# Patient Record
Sex: Male | Born: 1975 | Race: White | Hispanic: No | Marital: Married | Smoking: Former smoker
Health system: Southern US, Community
[De-identification: ages and names within clinical notes are randomized; demographics above are authoritative.]

## PROBLEM LIST (undated history)

## (undated) DIAGNOSIS — E039 Hypothyroidism, unspecified: Secondary | ICD-10-CM

## (undated) DIAGNOSIS — G8929 Other chronic pain: Secondary | ICD-10-CM

## (undated) DIAGNOSIS — G4730 Sleep apnea, unspecified: Secondary | ICD-10-CM

## (undated) DIAGNOSIS — F329 Major depressive disorder, single episode, unspecified: Secondary | ICD-10-CM

## (undated) DIAGNOSIS — I10 Essential (primary) hypertension: Secondary | ICD-10-CM

## (undated) DIAGNOSIS — M519 Unspecified thoracic, thoracolumbar and lumbosacral intervertebral disc disorder: Secondary | ICD-10-CM

## (undated) DIAGNOSIS — M549 Dorsalgia, unspecified: Secondary | ICD-10-CM

## (undated) DIAGNOSIS — E7800 Pure hypercholesterolemia, unspecified: Secondary | ICD-10-CM

## (undated) DIAGNOSIS — M1990 Unspecified osteoarthritis, unspecified site: Secondary | ICD-10-CM

## (undated) DIAGNOSIS — M797 Fibromyalgia: Secondary | ICD-10-CM

## (undated) DIAGNOSIS — F319 Bipolar disorder, unspecified: Secondary | ICD-10-CM

## (undated) DIAGNOSIS — F32A Depression, unspecified: Secondary | ICD-10-CM

## (undated) DIAGNOSIS — E079 Disorder of thyroid, unspecified: Secondary | ICD-10-CM

## (undated) HISTORY — PX: COLONOSCOPY: SHX174

## (undated) HISTORY — PX: BACK SURGERY: SHX140

## (undated) HISTORY — PX: CARPAL TUNNEL RELEASE: SHX101

## (undated) HISTORY — PX: BREAST LUMPECTOMY: SHX2

## (undated) HISTORY — PX: APPENDECTOMY: SHX54

---

## 2007-01-14 ENCOUNTER — Encounter (INDEPENDENT_AMBULATORY_CARE_PROVIDER_SITE_OTHER): Payer: Self-pay | Admitting: General Surgery

## 2007-01-14 ENCOUNTER — Ambulatory Visit (HOSPITAL_COMMUNITY): Admission: RE | Admit: 2007-01-14 | Discharge: 2007-01-14 | Payer: Self-pay | Admitting: General Surgery

## 2008-07-28 ENCOUNTER — Encounter (INDEPENDENT_AMBULATORY_CARE_PROVIDER_SITE_OTHER): Payer: Self-pay | Admitting: General Surgery

## 2008-07-28 ENCOUNTER — Inpatient Hospital Stay (HOSPITAL_COMMUNITY): Admission: EM | Admit: 2008-07-28 | Discharge: 2008-07-29 | Payer: Self-pay | Admitting: Emergency Medicine

## 2010-05-19 IMAGING — CR DG CHEST 1V PORT
1 series · 1 of 1 positions shown · non-contrast
Comparison: None

CLINICAL DATA: Decreased oxygen saturation.  Pancreatitis.

PORTABLE CHEST - 1 VIEW

[view not recorded]
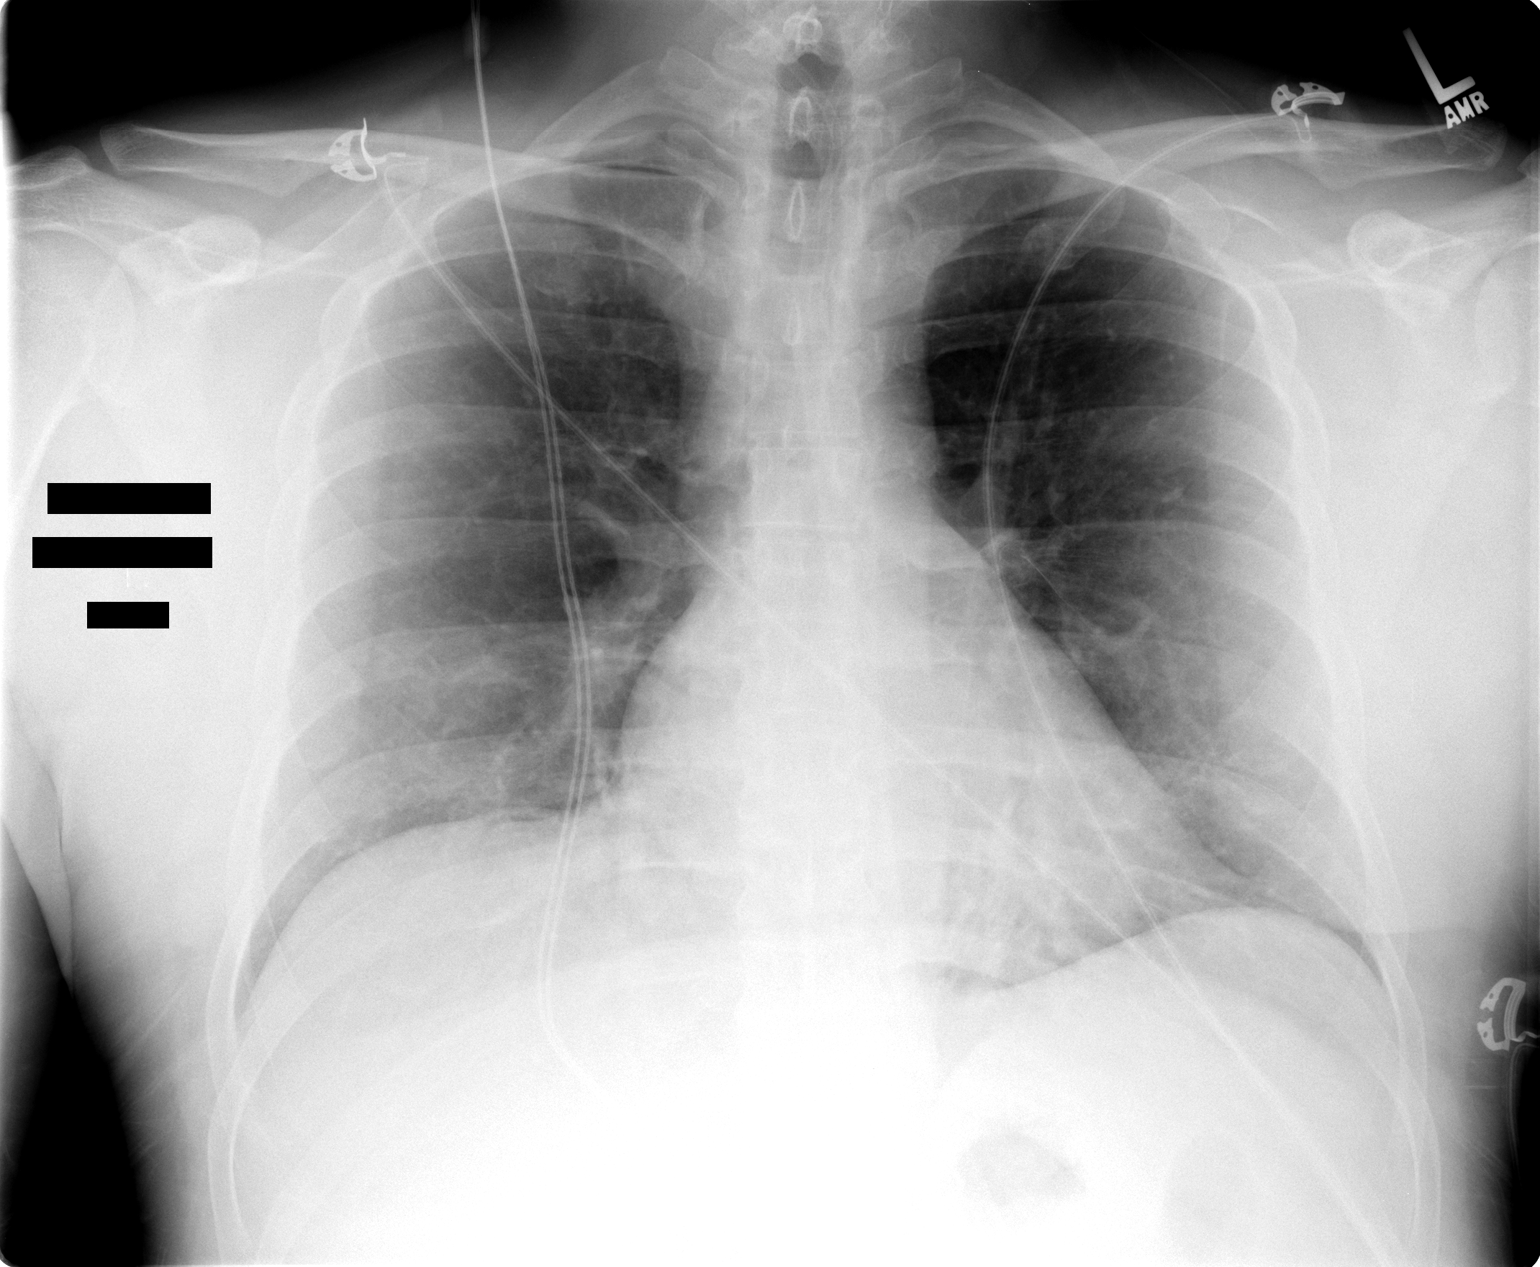

[1 of 1 positions shown; findings below may reference images not displayed]

FINDINGS: The heart size is normal.

There is no pleural effusion or pulmonary edema.

No airspace densities are identified.
IMPRESSION: 1.  No acute cardiopulmonary abnormalities.

## 2010-05-19 IMAGING — CT CT ABDOMEN W/ CM
2 of 4 series · 16 of 46 positions shown, 18 images · IV contrast (Omnipaque 300)
Comparison: None available.

CT ABDOMEN

CLINICAL DATA: Right side abdominal pain and fever.

CT ABDOMEN AND PELVIS WITH CONTRAST
TECHNIQUE: Multidetector CT imaging of the abdomen and pelvis was
performed using the standard protocol following bolus
administration of intravenous contrast.
Contrast: 100 ml Wmnipaque-8LL.

[Series 2: abd_pel_with 5.0 b40f · axial · 0.72mm/px · z∈[-524,-104]mm · 13 of 92 slices shown, 15 images]
[im 4/92  soft-tissue]
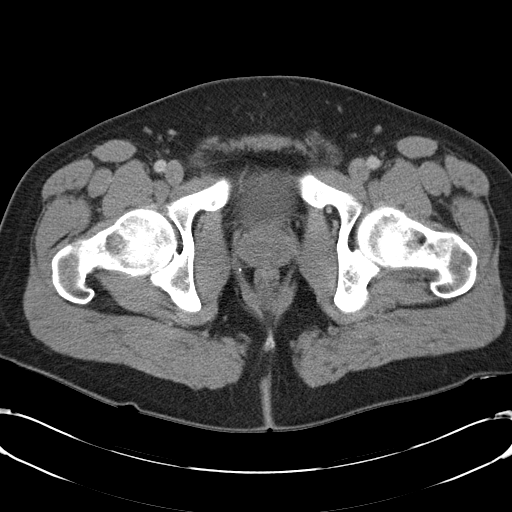
[im 4/92  bone]
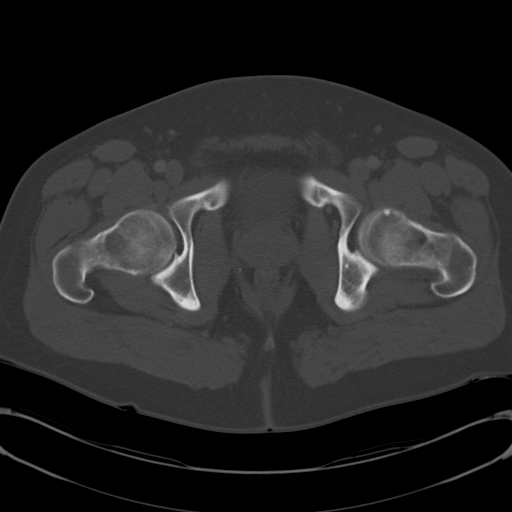
[im 12/92  soft-tissue]
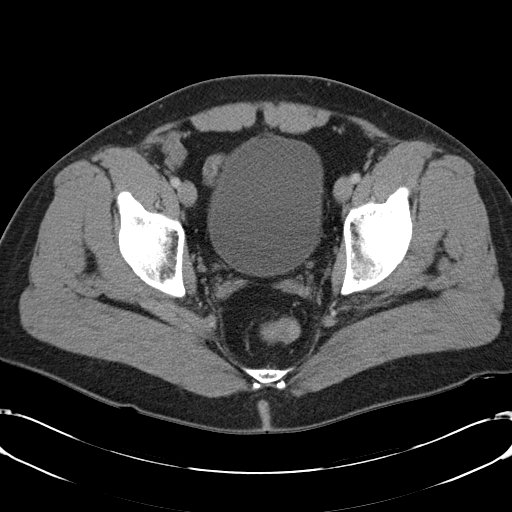
[im 20/92  soft-tissue]
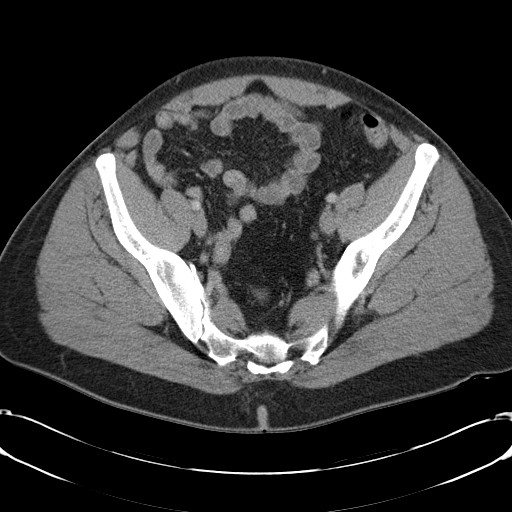
[im 24/92  soft-tissue]
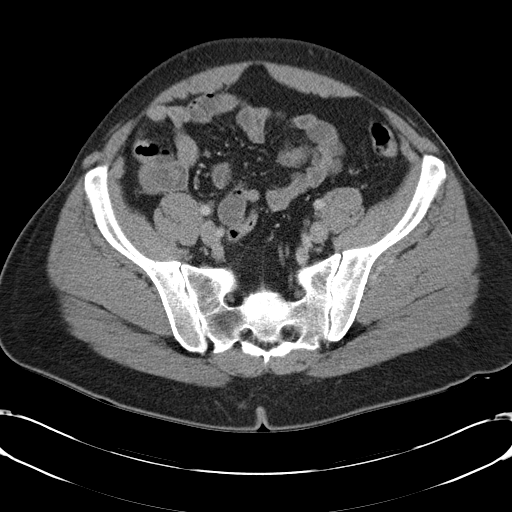
[im 32/92  soft-tissue]
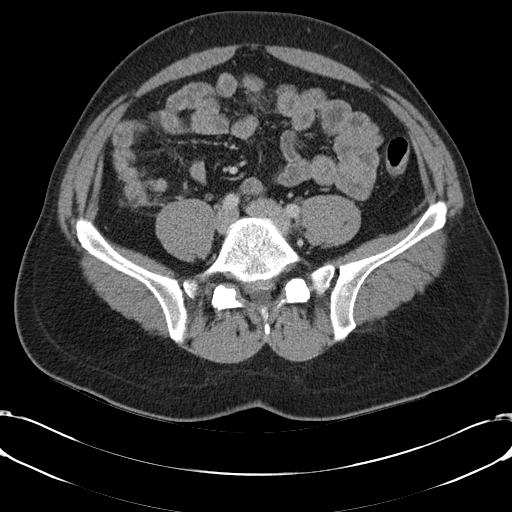
[im 40/92  soft-tissue]
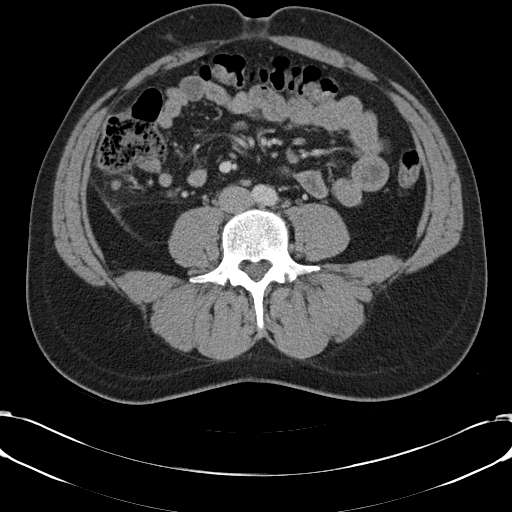
[im 48/92  soft-tissue]
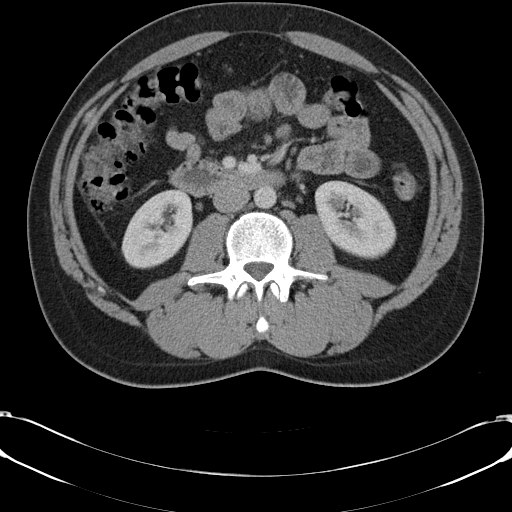
[im 52/92  soft-tissue]
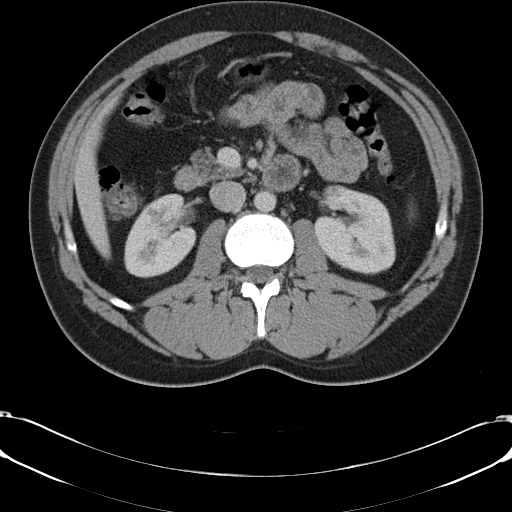
[im 60/92  soft-tissue]
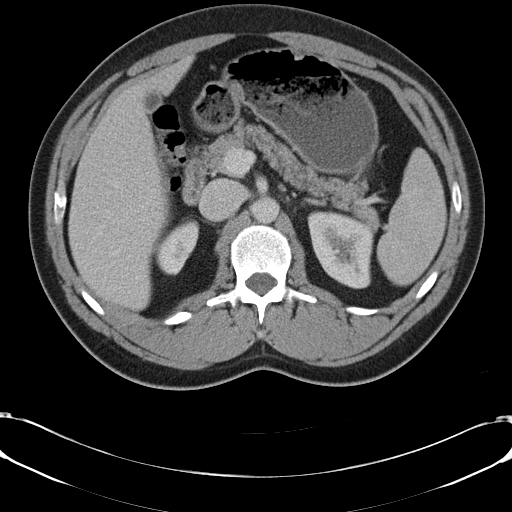
[im 60/92  bone]
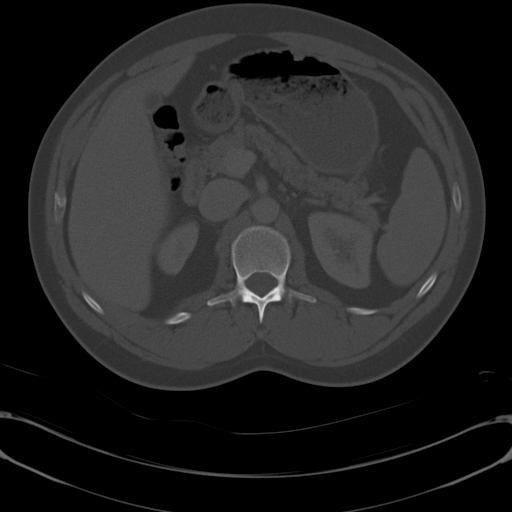
[im 68/92  soft-tissue]
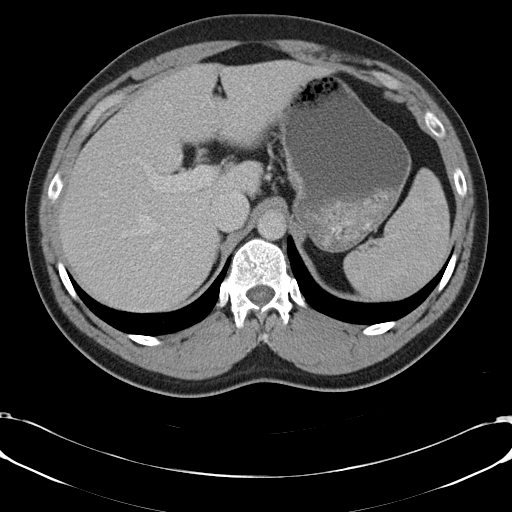
[im 72/92  soft-tissue]
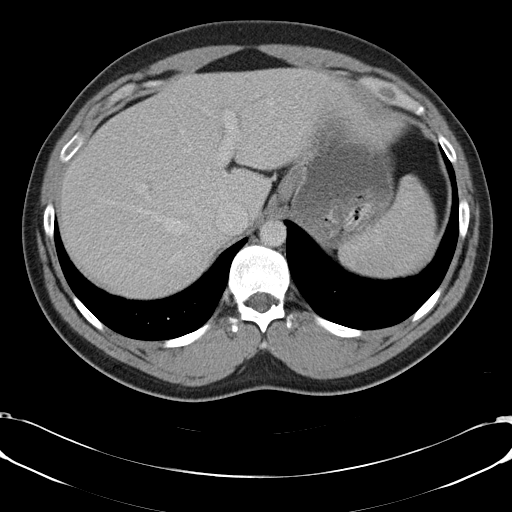
[im 80/92  soft-tissue]
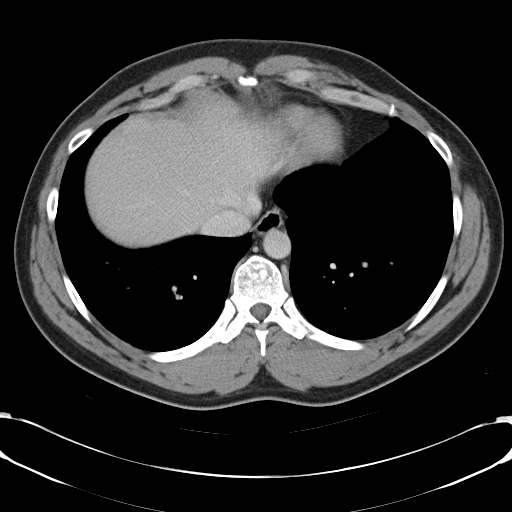
[im 88/92  soft-tissue]
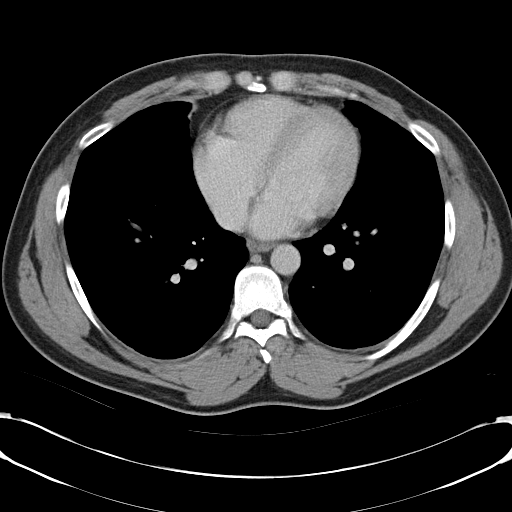

[Series 4: mpr cor post contrast (id) · coronal · 0.69mm/px · 3 of 83 slices shown]
[im 28/83  soft-tissue]
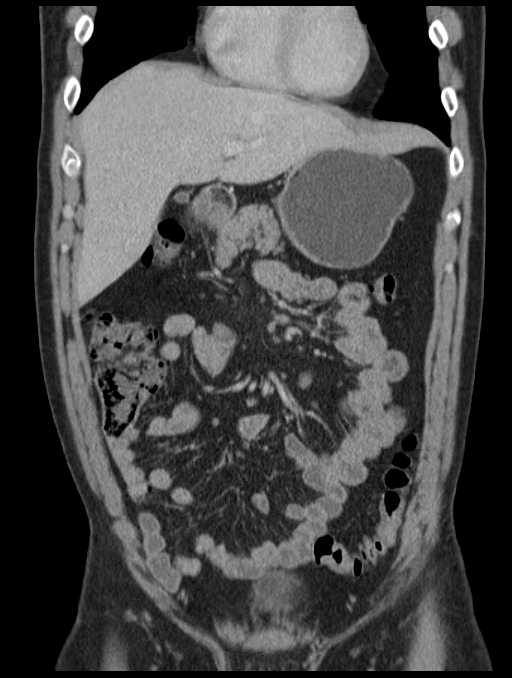
[im 37/83  soft-tissue]
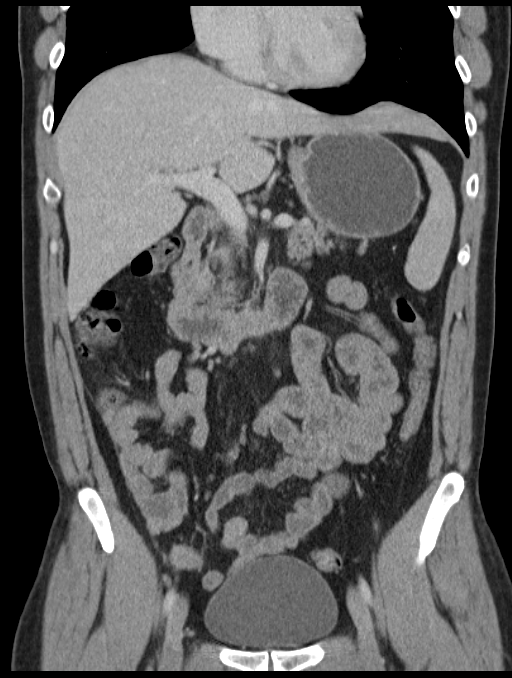
[im 46/83  soft-tissue]
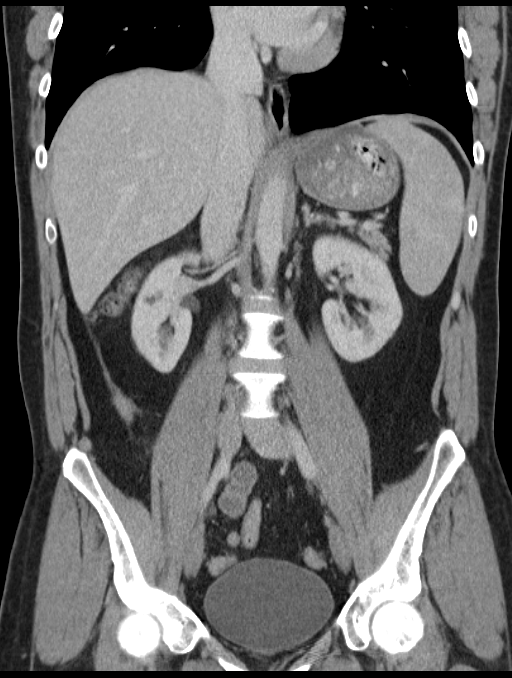

[16 of 46 positions shown; findings below may reference images not displayed]

FINDINGS: The lung bases are clear.  No pleural or pericardial
effusion.

The liver, gallbladder, adrenal glands, spleen and pancreas appear
normal.  Small low attenuating lesions are identified in both
kidneys compatible with cysts.  Kidneys otherwise unremarkable.
Stomach and small bowel appear normal.  No abdominal
lymphadenopathy or fluid.  No focal bony abnormality.
IMPRESSION: No acute finding.

CT PELVIS
FINDINGS: The appendix is dilated with periappendiceal
inflammatory change.  No abscess or perforation.  No pelvic fluid.
Urinary bladder, seminal vesicles and prostate gland are
unremarkable.  The colon appears normal.  No focal bony
abnormality.
IMPRESSION: Study is positive for acute appendicitis without abscess or
perforation.

## 2010-07-13 LAB — COMPREHENSIVE METABOLIC PANEL
AST: 15 U/L (ref 0–37)
Alkaline Phosphatase: 56 U/L (ref 39–117)
CO2: 28 mEq/L (ref 19–32)
Chloride: 105 mEq/L (ref 96–112)
Creatinine, Ser: 1.11 mg/dL (ref 0.4–1.5)
GFR calc Af Amer: >60 mL/min (ref >60)
GFR calc non Af Amer: >60 mL/min (ref >60)
Potassium: 3.7 mEq/L (ref 3.5–5.1)
Total Bilirubin: 0.7 mg/dL (ref 0.3–1.2)

## 2010-07-13 LAB — URINALYSIS, ROUTINE W REFLEX MICROSCOPIC
Glucose, UA: NEGATIVE mg/dL
Hgb urine dipstick: NEGATIVE
Specific Gravity, Urine: >1.03 — ABNORMAL HIGH (ref 1.005–1.030)
Urobilinogen, UA: 0.2 mg/dL (ref 0.0–1.0)

## 2010-07-13 LAB — DIFFERENTIAL
Basophils Absolute: 0 10*3/uL (ref 0.0–0.1)
Basophils Relative: 0 % (ref 0–1)
Eosinophils Absolute: 1.3 10*3/uL — ABNORMAL HIGH (ref 0.0–0.7)
Eosinophils Relative: 16 % — ABNORMAL HIGH (ref 0–5)
Lymphocytes Relative: 20 % (ref 12–46)
Lymphocytes Relative: 22 % (ref 12–46)
Lymphs Abs: 1.7 10*3/uL (ref 0.7–4.0)
Monocytes Relative: 8 % (ref 3–12)
Neutro Abs: 4 10*3/uL (ref 1.7–7.7)
Neutrophils Relative %: 51 % (ref 43–77)

## 2010-07-13 LAB — BASIC METABOLIC PANEL
BUN: 9 mg/dL (ref 6–23)
Calcium: 8.5 mg/dL (ref 8.4–10.5)
Chloride: 105 mEq/L (ref 96–112)
Creatinine, Ser: 1.18 mg/dL (ref 0.4–1.5)
GFR calc Af Amer: >60 mL/min (ref >60)
GFR calc non Af Amer: >60 mL/min (ref >60)

## 2010-07-13 LAB — CBC
HCT: 41.4 % (ref 39.0–52.0)
MCV: 89.9 fL (ref 78.0–100.0)
Platelets: 139 10*3/uL — ABNORMAL LOW (ref 150–400)
RBC: 4.22 MIL/uL (ref 4.22–5.81)
RBC: 4.61 MIL/uL (ref 4.22–5.81)
WBC: 7.7 10*3/uL (ref 4.0–10.5)
WBC: 8.5 10*3/uL (ref 4.0–10.5)

## 2010-08-16 NOTE — Op Note (Signed)
NAMEWYNNE, JURY            ACCOUNT NO.:  [id number]   MEDICAL RECORD NO.:  [id number]          PATIENT TYPE:  AMB   LOCATION:  DAY                           FACILITY:  [facility]   PHYSICIAN:  Tilford Pillar, MD      DATE OF BIRTH:  1975/11/22   DATE OF PROCEDURE:  01/14/2007  DATE OF DISCHARGE:                               OPERATIVE REPORT   PREOPERATIVE DIAGNOSIS:  Left breast mass.   POSTOPERATIVE DIAGNOSIS:  Left breast mass.   PROCEDURE:  Left breast excisional biopsy.   SURGEON:  Tilford Pillar, M.D.   ANESTHESIA:  MAC and local anesthetic.   SPECIMENS:  Left breast mass.   ESTIMATED BLOOD LOSS:  Minimal.   INDICATIONS:  Patient is a 35 year old male who had presented as an  outpatient to my office with noted fullness and palpable nodule under  his left nipple areolar complex.  This had been present for  approximately a month with no decrease in size.  He had noted no  drainage or any changes to the skin overlying the area; however, based  on concern and need for tissue diagnoses, it was recommended that he  undergo an excisional biopsy.  Risks, benefits and alternatives were  discussed at length with the patient, including the potential risks of  infection and bleeding.  The patient consented for the planned procedure  and is now taken to the operating room.   OPERATION:  Patient was placed in a supine position on the operative  table, at which time the sedation was administered and sedation took  effect.  A laryngeal mask airway was placed by anesthesia.  At this  point, his left chest and arm were prepped with the Betadine solution.  Drapes were placed in a standard fashion.  At this point, a local  anesthetic was injected subareolar.  An infra-areolar incision was  created with a 15 blade of the scalpel.  Additional dissection continued  of the subcuticular tissue was carried using electrocautery.  With a  combination of blunt and electrocautery dissection, the  mass under the  left areolar nipple complex was removed.  This was sent as a permanent  specimen to pathology.  Hemostasis was obtained with electrocautery.  The additional local anesthetic was injected.  The wound was irrigated.  Then 3-0 Vicryls were utilized for reapproximating the deep  subcuticular, and a 4-0 Monocryl was utilized in a subcuticular fashion  to reapproximate the skin edges.  The skin was then washed and dried.  Dermabond was then placed over the incision.  The drapes were removed.  The patient was allowed to come out of sedation and was transferred back  to a regular hospital bed.  He was transferred to the post anesthesia  care unit in stable condition.   At the conclusion of the procedure, all of the sponge and needle counts  were correct.  Patient tolerated the procedure well.      Tilford Pillar, MD  Electronically Signed     BZ/MEDQ  D:  01/14/2007  T:  01/15/2007  Job:  [id number]

## 2010-08-16 NOTE — H&P (Signed)
NAME:  Anthony Weaver, Anthony Weaver            ACCOUNT NO.:  [id number]   MEDICAL RECORD NO.:  [id number]         PATIENT TYPE:  AMB   LOCATION:  DAY                           FACILITY:  [facility]   PHYSICIAN:  Tilford Pillar, MD      DATE OF BIRTH:  10-11-1975   DATE OF ADMISSION:  DATE OF DISCHARGE:  [facility]                              HISTORY & PHYSICAL   CHIEF COMPLAINT:  Lump in left chest.   HISTORY OF PRESENT ILLNESS:  The patient is a 35 year old male with  approximately a 39-month history of a movable lump under his left nipple  in his left chest.  He states this had an insidious onset, no evidence  of any discharge or erythema over the area.  Initially it was tender,  but the tenderness has slightly improved.  He has not noticed any other  swelling or any other lumps or nodules on his axilla.  He has not had  anything sore in the past.  He denies remembering any trauma to the  area.  No family history of breast cancer, uterine cancer or ovarian  cancer in the family.  He denies any weight loss.  He did have a course  of antibiotics early on during the period when the knot was first  noticed under his left nipple, but stated this did not change the  tenderness or the size of the mass in the left breast.  Since the onset  he has not noticed any increase or decrease in size.   PAST MEDICAL HISTORY:  1. Positive for hypothyroidism.  2. Bipolar.   PAST SURGICAL HISTORY:  Bilateral carpal tunnel release.   MEDICATIONS:  1. Levothyroxine 75 mg 1 orally daily.  2. Fluoxetine 40 mg 1 orally daily.  3. Zyprexa 10 mg 1 orally daily.  4. Fexofenadine 180 mg 1 orally daily.  5. Oxazepam 10 mg 2 one time a day.   ALLERGIES:  NO KNOWN DRUG ALLERGIES.   SOCIAL HISTORY:  No tobacco use.  Occasional alcohol use.  No  recreational drug use.  Occupation, the patient works at [employer]  in [place], [place].   FAMILY HISTORY:  Pertinent for father diagnosed with colon cancer.  Mother was diagnosed  with thyroid disease.   REVIEW OF SYSTEMS:  CONSTITUTIONAL: Unremarkable.  HEENT:  Eyes,  unremarkable.  Ears, nose and throat, unremarkable.  RESPIRATORY:  Unremarkable.  CARDIOVASCULAR: Unremarkable.  GASTROINTESTINAL:  Occasional heartburn, indigestion; otherwise unremarkable.  GENITOURINARY:  Unremarkable.  MUSCULOSKELETAL:  Unremarkable.  SKIN:  Unremarkable other than HPI.  ENDOCRINOLOGY:  As patient is on treatment  for his thyroid disease he has had not had any symptomatology of thyroid  insufficiency.  NEUROLOGIC:  Unremarkable.   PHYSICAL EXAMINATION:  GENERAL APPEARANCE:  The patient is healthy, he  is a calm-appearing, well-developed.  HEENT:  No scalp or hair deformities were identified.  Eyes, pupils are  equal, round, reactive.  Extra-ocular movements are intact.  No  conjunctival pallor is noted.  No hearing deficits are noted.  Oral  mucosa was pink.  Normal occlusion.  NECK:  The trachea is midline.  No  thyroid nodule or goiters  appreciated.  No cervical lymphadenopathy is appreciated.  PULMONARY:  He has unlabored respirations.  CARDIOVASCULAR:  Regular rate and rhythm.  Pulses are 2+ bilaterally in  upper extremity and lower extremity.  CHEST:  On evaluation of chest wall, no visual deformities are noted.  On palpation of the left chest wall and breast there is some thickened  tissue deep to his nipple areolar complex on the left side.  This is  mildly tender to palpation.  It is mobile and is not attached to the  underlying tissue.  There is no axillary lymphadenopathy apparent and no  nipple discharge was expressed.  SKIN:  Is warm and dry.   PERTINENT LABORATORY AND X-RAY DATA:  The patient did have bilateral  mammograms and ultrasound of his left breast performed in [place].  Films were brought with the patient as well as the radiologist's report.  On the mammogram there is some irregular soft-tissue density in the left  breast deep to the nipple marker  seen on the mammogram.  In the  ultrasound of the left breast there are irregular hyperechoic nodules,  again, deep to the areolar complex.  Based on radiologist's review he  was given a BI-RADS  level 4 for the radiographic findings.   ASSESSMENT:  Left breast mass.  It was discussed with the patient  options for additional work up of the mass including excisional biopsy  of the mass including observation and repeat evaluation and ultrasound  in the next 2 months.  The benefits of tissue biopsy and pathologic  evaluation were discussed with the patient as well as the risks,  benefits and alternatives to performing the biopsy including the  possible risk of infection and bleeding in the operating room.  Based on  the physical findings, the patient's  history and age, and family  history, it is suspected that this is a benign process, likely  gynecomastia.  However, due to the patient's concern, we will plan on  proceed with biopsy of the lesion and excisional biopsy planned for next  week.      Tilford Pillar, MD     BZ/MEDQ  D:  01/08/2007  T:  01/08/2007  Job:  [id number]   cc:   Della Goo, M.D.  Fax: [phone]

## 2010-08-16 NOTE — H&P (Signed)
NAME:  Anthony Weaver, Anthony Weaver            ACCOUNT NO.:  [id number]   MEDICAL RECORD NO.:  [id number]          PATIENT TYPE:  AMB   LOCATION:  DAY                           FACILITY:  [facility]   PHYSICIAN:  Tilford Pillar, MD      DATE OF BIRTH:  04/05/1975   DATE OF ADMISSION:  DATE OF DISCHARGE:  [facility]                              HISTORY & PHYSICAL   CHIEF COMPLAINT:  Lump in left chest.   HISTORY OF PRESENT ILLNESS:  The patient is a 35 year old male with  approximately a 6-month history of a movable lump under his left nipple  in his left chest.  He states this had an insidious onset, no evidence  of any discharge or erythema over the area.  Initially it was tender,  but the tenderness has slightly improved.  He has not noticed any other  swelling or any other lumps or nodules on his axilla.  He has not had  anything sore in the past.  He denies remembering any trauma to the  area.  No family history of breast cancer, uterine cancer or ovarian  cancer in the family.  He denies any weight loss.  He did have a course  of antibiotics early on during the period when the knot was first  noticed under his left nipple, but stated this did not change the  tenderness or the size of the mass in the left breast.  Since the onset  he has not noticed any increase or decrease in size.   PAST MEDICAL HISTORY:  1. Positive for hypothyroidism.  2. Bipolar.   PAST SURGICAL HISTORY:  Bilateral carpal tunnel release.   MEDICATIONS:  1. Levothyroxine 75 mg 1 orally daily.  2. Fluoxetine 40 mg 1 orally daily.  3. Zyprexa 10 mg 1 orally daily.  4. Fexofenadine 180 mg 1 orally daily.  5. Oxazepam 10 mg 2 one time a day.   ALLERGIES:  NO KNOWN DRUG ALLERGIES.   SOCIAL HISTORY:  No tobacco use.  Occasional alcohol use.  No  recreational drug use.  Occupation, the patient works at [employer]  in [place], [place].   FAMILY HISTORY:  Pertinent for father diagnosed with colon cancer.  Mother was diagnosed  with thyroid disease.   REVIEW OF SYSTEMS:  CONSTITUTIONAL: Unremarkable.  HEENT:  Eyes,  unremarkable.  Ears, nose and throat, unremarkable.  RESPIRATORY:  Unremarkable.  CARDIOVASCULAR: Unremarkable.  GASTROINTESTINAL:  Occasional heartburn, indigestion; otherwise unremarkable.  GENITOURINARY:  Unremarkable.  MUSCULOSKELETAL:  Unremarkable.  SKIN:  Unremarkable other than HPI.  ENDOCRINOLOGY:  As patient is on treatment  for his thyroid disease he has had not had any symptomatology of thyroid  insufficiency.  NEUROLOGIC:  Unremarkable.   PHYSICAL EXAMINATION:  GENERAL APPEARANCE:  The patient is healthy, he  is a calm-appearing, well-developed.  HEENT:  No scalp or hair deformities were identified.  Eyes, pupils are  equal, round, reactive.  Extra-ocular movements are intact.  No  conjunctival pallor is noted.  No hearing deficits are noted.  Oral  mucosa was pink.  Normal occlusion.  NECK:  The trachea is midline.  No thyroid nodule or goiters  appreciated.  No cervical lymphadenopathy is appreciated.  PULMONARY:  He has unlabored respirations.  CARDIOVASCULAR:  Regular rate and rhythm.  Pulses are 2+ bilaterally in  upper extremity and lower extremity.  CHEST:  On evaluation of chest wall, no visual deformities are noted.  On palpation of the left chest wall and breast there is some thickened  tissue deep to his nipple areolar complex on the left side.  This is  mildly tender to palpation.  It is mobile and is not attached to the  underlying tissue.  There is no axillary lymphadenopathy apparent and no  nipple discharge was expressed.  SKIN:  Is warm and dry.   PERTINENT LABORATORY AND X-RAY DATA:  The patient did have bilateral  mammograms and ultrasound of his left breast performed in [place].  Films were brought with the patient as well as the radiologist's report.  On the mammogram there is some irregular soft-tissue density in the left  breast deep to the nipple marker  seen on the mammogram.  In the  ultrasound of the left breast there are irregular hyperechoic nodules,  again, deep to the areolar complex.  Based on radiologist's review he  was given a BI-RADS  level 4 for the radiographic findings.   ASSESSMENT:  Left breast mass.  It was discussed with the patient  options for additional work up of the mass including excisional biopsy  of the mass including observation and repeat evaluation and ultrasound  in the next 2 months.  The benefits of tissue biopsy and pathologic  evaluation were discussed with the patient as well as the risks,  benefits and alternatives to performing the biopsy including the  possible risk of infection and bleeding in the operating room.  Based on  the physical findings, the patient's  history and age, and family  history, it is suspected that this is a benign process, likely  gynecomastia.  However, due to the patient's concern, we will plan on  proceed with biopsy of the lesion and excisional biopsy planned for next  week.      Tilford Pillar, MD  Electronically Signed     BZ/MEDQ  D:  01/08/2007  T:  01/08/2007  Job:  [id number]   cc:   Della Goo, M.D.  Fax: [phone]

## 2010-08-16 NOTE — H&P (Signed)
NAME:  Anthony Weaver, Anthony Weaver            ACCOUNT NO.:  [id number]   MEDICAL RECORD NO.:  [id number]          PATIENT TYPE:  INP   LOCATION:  A326                          FACILITY:  [facility]   PHYSICIAN:  Tilford Pillar, MD      DATE OF BIRTH:  September 15, 1975   DATE OF ADMISSION:  07/27/2008  DATE OF DISCHARGE:  04/28/2010[facility]                              HISTORY & PHYSICAL   CHIEF COMPLAINT:  Abdominal pain.   HISTORY OF PRESENT ILLNESS:  The patient is a 35 year old male  relatively healthy male with a history of hypercholesterolemia and  bipolar disorder, approximately 2 days of increasing right lower  __________ has been constant and it is sharp in characteristics.  There  is no radiation of the pain.  Pain is increased with movement and  palpation.  There has been no change with pain associated with diet.  He  has had no change in bowel movements.  No melena.  No hematochezia.  He  has had questionable subjective fevers at home, although he states he  has not recorded the temperature.  His appetite is not significantly  diminished, although the patient does admit to decreased p.o. intake.  He has had no sick contacts.  He has had no similar symptomatology in  the past.   PAST MEDICAL HISTORY:  Hypercholesterolemia, bipolar disorder, and  hypothyroidism.   PAST SURGICAL HISTORY:  He has had carpal tunnel surgery and a  lumpectomy of a chest mass.   MEDICATIONS:  Synthroid, Lexapro, Klonopin, and Zyprexa.   ALLERGIES:  No known drug allergies.   SOCIAL HISTORY:  No tobacco, no alcohol, no recreational drug use.   REVIEW OF SYSTEMS:  Essentially unremarkable except for HPI for all  systems including constitutional, eyes, ears, nose and throat,  respiratory, cardiovascular, genitourinary, musculoskeletal, skin,  neuro, and endocrine.   PHYSICAL EXAMINATION:  VITAL SIGNS:  Temperature 97.7, heart rate 72,  respiratory rate is 20, and blood pressure 134/67.  GENERAL:  The patient is a  supine position in the emergency room.  Currently, he is not in any distress.  He is alert and oriented x3.  He  does appear uncomfortable, but again he is not in any acute distress.  HEENT:  Scalp, no deformities.  Eyes; pupils equal, round, and reactive.  Extraocular movements are intact.  No conjunctival pallor or scleral  icterus is noted.  Oral mucosa is pink.  Normal occlusion.  NECK:  Trachea is midline.  No cervical lymphadenopathy.  No thyroid  nodularity or goiter is noted.  PULMONARY:  Unlabored respiration.  Chest is clear to auscultation  bilaterally.  No wheezes or crackles are apparent.  CARDIOVASCULAR:  Regular rate and rhythm.  No murmurs or gallops.  He  has 2+ radial, femoral and dorsalis pedis pulses bilaterally.  ABDOMEN:  Diminished bowel sounds.  Abdomen is soft, flat.  He does have  pain at McBurney point.  There is no diffuse peritoneal signs.  No  hernias or masses are elicited.  EXTREMITIES:  Warm and dry.   PERTINENT LABORATORY AND RADIOGRAPHIC STUDIES:  CBC; white blood cell  count is  8.5, hemoglobin 14.5, hematocrit 41.4, and platelets 101.  Basic metabolic panel; sodium 139 potassium 3.7, chloride 105, bicarb  28, BUN 12, creatinine 1.12, and blood glucose 114.  CT of the abdomen  and pelvis demonstrates no evidence of any free air or free fluid.  There is significant dilatation of the appendix with wall thickening of  the appendix, pericecal, and periappendiceal fat stranding noted.   ASSESSMENT/PLAN:  Acute appendicitis.  At this time, the patient is  being kept n.p.o.  He is to continue on IV fluid, resuscitation, and  deep vein thrombosis prophylaxis.  He has been started on Floxin for  antibiotic coverage.  The risks, benefits, and alternatives of the  laparoscopic possible open appendectomy were discussed with the patient  including, but not limited to the risk of bleeding, infection, stump  leak, as well as the possibility of intraoperative cardiac  and pulmonary  events.  The patient's questions and concerns were addressed.  The  patient will be admitted and we will take him to the operating room for  emergent appendectomy.      Tilford Pillar, MD  Electronically Signed     BZ/MEDQ  D:  07/29/2008  T:  07/29/2008  Job:  [id number]   cc:   Carlota Raspberry  Fax: [phone]

## 2010-08-16 NOTE — Op Note (Signed)
NAME:  Anthony Weaver, Anthony Weaver            ACCOUNT NO.:  [id number]   MEDICAL RECORD NO.:  [id number]          PATIENT TYPE:  INP   LOCATION:  A326                          FACILITY:  [facility]   PHYSICIAN:  Tilford Pillar, MD      DATE OF BIRTH:  05-11-1975   DATE OF PROCEDURE:  07/28/2008  DATE OF DISCHARGE:  07/29/2008                               OPERATIVE REPORT   PREOPERATIVE DIAGNOSIS:  Acute appendicitis.   POSTOPERATIVE DIAGNOSIS:  Acute appendicitis.   PROCEDURE:  Emergent laparoscopic appendectomy.   SURGEON:  Tilford Pillar, MD   ANESTHESIA:  General endotracheal, local anesthetic 0.5% Sensorcaine  plain.   SPECIMEN:  Appendix.   ESTIMATED BLOOD LOSS:  Minimal.   DISPOSITION:  Postanesthetic area in stable condition.   INDICATIONS:  The patient is a 35 year old male who presented to [facility] Emergency Department with approximately 2 days of increasing right  lower quadrant abdominal pain.  In the emergency department he was  evaluated and was diagnosed with acute appendicitis, CT confirmed.  Risks, benefits, and alternatives of a laparoscopic possible open  appendectomy have been discussed with the patient, including but not  limited to risk of bleeding, infection, staple line for appendiceal  stump leak, bowel injury as well as possibility intraoperative cardiac  and pulmonary events.  The patient's questions and concerns had been  addressed and the patient consented for the planned procedure.   DESCRIPTION OF PROCEDURE:  The patient was taken to the operating room,  placed in the supine position on the operating table, at which time the  general anesthetic was administered.  Once the patient was asleep, he  was endotracheally intubated by Anesthesia.  At this point a Foley  catheter was placed in standard sterile fashion by myself and his  abdomen was prepped with DuraPrep solution and draped in standard  fashion. A stab incision was created supraumbilically with 11  blade  scalpel.  Additional dissection down through the subcutaneous tissue was  carried out using carried out using a Kocher clamp, which was utilized  to grasp the anterior abdominal fascia anteriorly.  A Veress needle was  inserted.  Saline drop test was utilized to confirm intraperitoneal  placement and then a pneumoperitoneum was initiated.  Once sufficient  pneumoperitoneum was obtained, a 11-mm trocar was inserted over  laparoscope allowing visualization of trocar entering into the  peritoneum.  At this point, the inner cannula was removed.  The  laparoscope was reinserted.  There is no evidence of any trocar or  Veress needle placement injury.  The remaining trocars were placed with  the 5-mm trocar in the suprapubic region and an 11-mm trocar in the left  lateral abdominal wall.  The patient was placed into a Trendelenburg  left lateral decubitus position.  The cecum was identified.  Following  the taenia proximally, the appendix was identified.  The appendix was  noted to be indurated and hyperemic in appearance.  It was grasped and a  window was created between the appendix and the mesoappendix at the base  of the appendix where they join the cecum.  Once  the window was created  a 45 load of an Endo-GIA regular stapler was utilized to divide the base  of the appendix at the cecum and 2 subsequent vascular reloads of the  Endo-GIA 45 stapler were utilized to divide the mesoappendix.  At this  point, the appendix was free, was placed to a EndoCatch bag, which was  placed up to the right upper quadrant of the abdomen.  At this point, I  did inspect staple line.  I examined base of the appendiceal staple line  and there was no evidence of any bleeding from the mesoappendix staple  line.  At this time I did turn my attention to closure.   Using an Endoclose suture passing device, I passed a 2-0 Vicryl suture  to both the 11-mm trocar sites.  These sutures were in place.  The   appendix was grasped in the EndoCatch bag and was removed through the  umbilical trocar site in an intact  EndoCatch bag.  The appendix was  placed in the back table and was sent as a permanent specimen to  Pathology.  A 4-0 Monocryl was utilized to reapproximate the skin edges  at all 4 trocar site.  The skin was washed and dried with a moist and  dry towel.  Benzoin was applied around the incision.  Half inch Steri-  Strips were placed.  The patient was allowed to come out of general  anesthetic.      Tilford Pillar, MD  Electronically Signed     BZ/MEDQ  D:  07/29/2008  T:  07/29/2008  Job:  [id number]

## 2010-08-19 NOTE — Discharge Summary (Signed)
NAME:  DEONTEZ, KLINKE            ACCOUNT NO.:  [id number]   MEDICAL RECORD NO.:  [id number]          PATIENT TYPE:  INP   LOCATION:  A326                          FACILITY:  [facility]   PHYSICIAN:  Tilford Pillar, MD      DATE OF BIRTH:  1975-08-31   DATE OF ADMISSION:  07/27/2008  DATE OF DISCHARGE:  04/28/2010LH                               DISCHARGE SUMMARY   ADMISSION DIAGNOSIS:  Acute appendicitis.   DISCHARGE DIAGNOSES:  1. Acute appendicitis.  2. Bipolar disorder.  3. Hypercholesterolemia.  4. Hypothyroidism.   PROCEDURES:  Laparoscopic appendectomy.   DISPOSITION:  Home.   BRIEF HISTORY AND PHYSICAL:  Please see admission history and physical.   The patient is a 35 year old male, who presents to [facility]  with right lower quadrant abdominal pain on evaluation.  This was  consistent with acute appendicitis.  It was confirmed with CT  evaluation.  Risks, benefits, and alternatives of laparoscopic possible  open appendectomy were discussed and the patient was admitted for  planned procedure and continued management and intervention.   HOSPITAL COURSE:  The patient was admitted on July 28, 2008.  He is a  23-hour observation patient.  He did undergo a laparoscopic  appendectomy.  Tolerate this extremely well as this is an evening  procedure.  He did spend the night, but was discharged within 23 hours  of his admission at the following morning as he was tolerating regular  diet.  Pain was controlled on oral pain medication and ambulating.   DISCHARGE INSTRUCTIONS:  The patient increased his activity as  tolerated.  He should not lift anything greater than 20 pounds for the  next 3-4 weeks.  He may shower, but he is not to soak the incision for 3  weeks.  He may resume a normal diet.   DISCHARGE MEDICATIONS:  The patient is to resume all previously  prescribed home medications, Lortab 5/500 one to two p.o. q.4 h p.r.n.  pain.  The patient is to followup in my  office in 3 weeks unless he has  had any questions, concerns, or problems.      Tilford Pillar, MD  Electronically Signed     BZ/MEDQ  D:  09/14/2008  T:  09/15/2008  Job:  [id number]

## 2011-01-12 LAB — BASIC METABOLIC PANEL
CO2: 29
GFR calc Af Amer: >60
Glucose, Bld: 97
Potassium: 4.1
Sodium: 140

## 2011-01-12 LAB — CBC
HCT: 43.6
Hemoglobin: 14.9
MCHC: 34.2
RBC: 4.92
RDW: 12.6

## 2012-05-10 ENCOUNTER — Other Ambulatory Visit: Payer: Self-pay | Admitting: Physician Assistant

## 2012-05-10 DIAGNOSIS — J329 Chronic sinusitis, unspecified: Secondary | ICD-10-CM

## 2012-07-07 ENCOUNTER — Emergency Department (HOSPITAL_COMMUNITY)
Admission: EM | Admit: 2012-07-07 | Discharge: 2012-07-08 | Disposition: A | Discharge status: Home / Self Care | Payer: BC Managed Care – PPO | Attending: Emergency Medicine | Admitting: Emergency Medicine

## 2012-07-07 ENCOUNTER — Encounter (HOSPITAL_COMMUNITY): Payer: Self-pay

## 2012-07-07 DIAGNOSIS — Z79899 Other long term (current) drug therapy: Secondary | ICD-10-CM | POA: Insufficient documentation

## 2012-07-07 DIAGNOSIS — E7800 Pure hypercholesterolemia, unspecified: Secondary | ICD-10-CM | POA: Insufficient documentation

## 2012-07-07 DIAGNOSIS — Z8739 Personal history of other diseases of the musculoskeletal system and connective tissue: Secondary | ICD-10-CM | POA: Insufficient documentation

## 2012-07-07 DIAGNOSIS — E079 Disorder of thyroid, unspecified: Secondary | ICD-10-CM | POA: Insufficient documentation

## 2012-07-07 DIAGNOSIS — M545 Low back pain, unspecified: Secondary | ICD-10-CM | POA: Insufficient documentation

## 2012-07-07 DIAGNOSIS — M549 Dorsalgia, unspecified: Secondary | ICD-10-CM

## 2012-07-07 DIAGNOSIS — F319 Bipolar disorder, unspecified: Secondary | ICD-10-CM | POA: Insufficient documentation

## 2012-07-07 HISTORY — DX: Bipolar disorder, unspecified: F31.9

## 2012-07-07 HISTORY — DX: Unspecified thoracic, thoracolumbar and lumbosacral intervertebral disc disorder: M51.9

## 2012-07-07 HISTORY — DX: Major depressive disorder, single episode, unspecified: F32.9

## 2012-07-07 HISTORY — DX: Pure hypercholesterolemia, unspecified: E78.00

## 2012-07-07 HISTORY — DX: Disorder of thyroid, unspecified: E07.9

## 2012-07-07 HISTORY — DX: Depression, unspecified: F32.A

## 2012-07-07 MED ORDER — DIAZEPAM 5 MG PO TABS
5.0000 mg | ORAL_TABLET | Freq: Once | ORAL | Status: AC
  Administered 2012-07-08: 5 mg via ORAL
  Filled 2012-07-07: qty 1

## 2012-07-07 MED ORDER — ONDANSETRON HCL 4 MG PO TABS
4.0000 mg | ORAL_TABLET | Freq: Once | ORAL | Status: AC
  Administered 2012-07-08: 4 mg via ORAL
  Filled 2012-07-07: qty 1

## 2012-07-07 MED ORDER — KETOROLAC TROMETHAMINE 10 MG PO TABS
10.0000 mg | ORAL_TABLET | Freq: Once | ORAL | Status: AC
  Administered 2012-07-08: 10 mg via ORAL
  Filled 2012-07-07: qty 1

## 2012-07-07 MED ORDER — HYDROCODONE-ACETAMINOPHEN 5-325 MG PO TABS
2.0000 | ORAL_TABLET | Freq: Once | ORAL | Status: AC
  Administered 2012-07-08: 2 via ORAL
  Filled 2012-07-07: qty 2

## 2012-07-07 NOTE — ED Provider Notes (Signed)
History     CSN: [id number]  Arrival date & time 07/07/12  2310   First MD Initiated Contact with Patient 07/07/12 2315      Chief Complaint  Patient presents with  . Back Pain    (Consider location/radiation/quality/duration/timing/severity/associated sxs/prior treatment) Patient is a 37 y.o. male presenting with back pain. The history is provided by the patient.  Back Pain Location:  Lumbar spine Quality:  Aching Radiates to:  R foot and R posterior upper leg Pain severity:  Severe Pain is:  Same all the time Duration:  2 days Timing:  Constant Progression:  Worsening Chronicity: chronic back pain worsening. Context comment:  Hx of L5S1 disc disease. Relieved by:  Nothing Worsened by:  Movement and standing Ineffective treatments:  Ibuprofen and heating pad Associated symptoms: no abdominal pain, no bladder incontinence, no bowel incontinence, no chest pain, no dysuria and no perianal numbness     Past Medical History  Diagnosis Date  . Disc disorder   . Thyroid disease   . High cholesterol   . Bipolar disorder   . Depression     Past Surgical History  Procedure Laterality Date  . Appendectomy    . Carpal tunnel release      No family history on file.  History  Substance Use Topics  . Smoking status: Not on file  . Smokeless tobacco: Not on file  . Alcohol Use: No      Review of Systems  Constitutional: Negative for activity change.       All ROS Neg except as noted in HPI  HENT: Negative for nosebleeds and neck pain.   Eyes: Negative for photophobia and discharge.  Respiratory: Negative for cough, shortness of breath and wheezing.   Cardiovascular: Negative for chest pain and palpitations.  Gastrointestinal: Negative for abdominal pain, blood in stool and bowel incontinence.  Genitourinary: Negative for bladder incontinence, dysuria, frequency and hematuria.  Musculoskeletal: Positive for back pain. Negative for arthralgias.  Skin: Negative.    Neurological: Negative for dizziness, seizures and speech difficulty.  Psychiatric/Behavioral: Negative for hallucinations and confusion.       Depression    Allergies  Review of patient's allergies indicates no known allergies.  Home Medications   Current Outpatient Rx  Name  Route  Sig  Dispense  Refill  . FLUoxetine (PROZAC) 40 MG capsule   Oral   Take 40 mg by mouth daily.         Marland Kitchen levothyroxine (SYNTHROID, LEVOTHROID) 100 MCG tablet   Oral   Take 100 mcg by mouth daily before breakfast.         . OLANZapine (ZYPREXA) 15 MG tablet   Oral   Take 15 mg by mouth at bedtime.         . pravastatin (PRAVACHOL) 40 MG tablet   Oral   Take 80 mg by mouth at bedtime.           BP 142/91  Pulse 98  Temp(Src) 97.8 F (36.6 C) (Oral)  Resp 20  Ht 6\' 1"  (1.854 m)  Wt 240 lb (108.863 kg)  BMI 31.67 kg/m2  SpO2 99%  Physical Exam  Nursing note and vitals reviewed. Constitutional: He is oriented to person, place, and time. He appears well-developed and well-nourished.  Non-toxic appearance.  HENT:  Head: Normocephalic.  Right Ear: Tympanic membrane and external ear normal.  Left Ear: Tympanic membrane and external ear normal.  Eyes: EOM and lids are normal. Pupils are equal, round, and  reactive to light.  Neck: Normal range of motion. Neck supple. Carotid bruit is not present.  Cardiovascular: Normal rate, regular rhythm, normal heart sounds, intact distal pulses and normal pulses.   Pulmonary/Chest: Breath sounds normal. No respiratory distress.  Abdominal: Soft. Bowel sounds are normal. There is no tenderness. There is no guarding.  Musculoskeletal: Normal range of motion.  Lumbar area tenderness. Paraspinal spasm right more than left. No hot areas.  Lymphadenopathy:       Head (right side): No submandibular adenopathy present.       Head (left side): No submandibular adenopathy present.    He has no cervical adenopathy.  Neurological: He is alert and  oriented to person, place, and time. He has normal strength. No cranial nerve deficit or sensory deficit.  Skin: Skin is warm and dry.  Psychiatric: He has a normal mood and affect. His speech is normal.    ED Course  Procedures (including critical care time)  Labs Reviewed - No data to display No results found.   No diagnosis found.    MDM  I have reviewed nursing notes, vital signs, and all appropriate lab and imaging results for this patient.  Patient has history of degenerative disc disease. He has documented L5 and S1 bulging disc. He is scheduled for MRI on tomorrow. Having increased pain tonight and request assistance.  No gross neurologic deficit appreciated on examination.  Patient treated with Mobic 7.5, Robaxin 500, and Norco one or 2 tablets every 4 hours as needed for pain.      Kathie Dike, PA-C 07/08/12 [phone]

## 2012-07-07 NOTE — ED Notes (Signed)
Low back pain, hurting really bad per pt.  Have an MRI scheduled for tomorrow per pt. L5 and S1 have bulging discs per pt. I already have a surgeon per pt. Just need some relief per pt.

## 2012-07-08 MED ORDER — MELOXICAM 7.5 MG PO TABS: 7.5000 mg | ORAL_TABLET | Freq: Every day | ORAL | Status: DC

## 2012-07-08 MED ORDER — HYDROCODONE-ACETAMINOPHEN 5-325 MG PO TABS: ORAL_TABLET | ORAL | Status: DC

## 2012-07-08 MED ORDER — METHOCARBAMOL 500 MG PO TABS: 500.0000 mg | ORAL_TABLET | Freq: Three times a day (TID) | ORAL | Status: DC

## 2012-07-08 NOTE — ED Provider Notes (Signed)
Medical screening examination/treatment/procedure(s) were performed by non-physician practitioner and as supervising physician I was immediately available for consultation/collaboration.  Nicoletta Dress. Colon Branch, MD 07/08/12 [phone]

## 2013-03-14 ENCOUNTER — Emergency Department (HOSPITAL_COMMUNITY)
Admission: EM | Admit: 2013-03-14 | Discharge: 2013-03-14 | Disposition: A | Discharge status: Home / Self Care | Payer: BC Managed Care – PPO | Attending: Emergency Medicine | Admitting: Emergency Medicine

## 2013-03-14 ENCOUNTER — Emergency Department (HOSPITAL_COMMUNITY): Payer: BC Managed Care – PPO

## 2013-03-14 ENCOUNTER — Encounter (HOSPITAL_COMMUNITY): Payer: Self-pay | Admitting: Emergency Medicine

## 2013-03-14 DIAGNOSIS — Z791 Long term (current) use of non-steroidal anti-inflammatories (NSAID): Secondary | ICD-10-CM | POA: Insufficient documentation

## 2013-03-14 DIAGNOSIS — Z79899 Other long term (current) drug therapy: Secondary | ICD-10-CM | POA: Insufficient documentation

## 2013-03-14 DIAGNOSIS — F319 Bipolar disorder, unspecified: Secondary | ICD-10-CM | POA: Insufficient documentation

## 2013-03-14 DIAGNOSIS — Z8739 Personal history of other diseases of the musculoskeletal system and connective tissue: Secondary | ICD-10-CM | POA: Insufficient documentation

## 2013-03-14 DIAGNOSIS — E079 Disorder of thyroid, unspecified: Secondary | ICD-10-CM | POA: Insufficient documentation

## 2013-03-14 DIAGNOSIS — E7800 Pure hypercholesterolemia, unspecified: Secondary | ICD-10-CM | POA: Insufficient documentation

## 2013-03-14 DIAGNOSIS — R630 Anorexia: Secondary | ICD-10-CM | POA: Insufficient documentation

## 2013-03-14 DIAGNOSIS — K2970 Gastritis, unspecified, without bleeding: Secondary | ICD-10-CM

## 2013-03-14 DIAGNOSIS — R5381 Other malaise: Secondary | ICD-10-CM | POA: Insufficient documentation

## 2013-03-14 LAB — CBC WITH DIFFERENTIAL/PLATELET
Basophils Absolute: 0.1 10*3/uL (ref 0.0–0.1)
Basophils Relative: 1 % (ref 0–1)
Eosinophils Relative: 1 % (ref 0–5)
HCT: 44.7 % (ref 39.0–52.0)
Hemoglobin: 15 g/dL (ref 13.0–17.0)
Lymphocytes Relative: 14 % (ref 12–46)
Lymphs Abs: 1.2 10*3/uL (ref 0.7–4.0)
MCH: 30.5 pg (ref 26.0–34.0)
MCV: 90.9 fL (ref 78.0–100.0)
Monocytes Absolute: 0.7 10*3/uL (ref 0.1–1.0)
Monocytes Relative: 8 % (ref 3–12)
Neutro Abs: 6.7 10*3/uL (ref 1.7–7.7)
RDW: 12.1 % (ref 11.5–15.5)
WBC: 8.7 10*3/uL (ref 4.0–10.5)

## 2013-03-14 LAB — COMPREHENSIVE METABOLIC PANEL
Albumin: 4.3 g/dL (ref 3.5–5.2)
Alkaline Phosphatase: 62 U/L (ref 39–117)
BUN: 18 mg/dL (ref 6–23)
CO2: 30 mEq/L (ref 19–32)
Calcium: 9.7 mg/dL (ref 8.4–10.5)
Chloride: 97 mEq/L (ref 96–112)
Creatinine, Ser: 1.22 mg/dL (ref 0.50–1.35)
GFR calc Af Amer: 85 mL/min — ABNORMAL LOW (ref >90)
GFR calc non Af Amer: 73 mL/min — ABNORMAL LOW (ref >90)
Glucose, Bld: 104 mg/dL — ABNORMAL HIGH (ref 70–99)
Total Bilirubin: 0.5 mg/dL (ref 0.3–1.2)

## 2013-03-14 LAB — OCCULT BLOOD GASTRIC / DUODENUM (SPECIMEN CUP): Occult Blood, Gastric: NEGATIVE — AB

## 2013-03-14 LAB — URINALYSIS, ROUTINE W REFLEX MICROSCOPIC
Ketones, ur: >80 mg/dL — AB
Leukocytes, UA: NEGATIVE
Nitrite: NEGATIVE
Protein, ur: NEGATIVE mg/dL
pH: >9 — ABNORMAL HIGH (ref 5.0–8.0)

## 2013-03-14 LAB — LACTIC ACID, PLASMA: Lactic Acid, Venous: 0.9 mmol/L (ref 0.5–2.2)

## 2013-03-14 LAB — LIPASE, BLOOD: Lipase: 13 U/L (ref 11–59)

## 2013-03-14 MED ORDER — HYDROCODONE-ACETAMINOPHEN 5-325 MG PO TABS: 2.0000 | ORAL_TABLET | ORAL | Status: DC | PRN

## 2013-03-14 MED ORDER — ONDANSETRON HCL 4 MG/2ML IJ SOLN
4.0000 mg | Freq: Once | INTRAMUSCULAR | Status: AC
  Administered 2013-03-14: 4 mg via INTRAVENOUS
  Filled 2013-03-14: qty 2

## 2013-03-14 MED ORDER — IOHEXOL 300 MG/ML  SOLN
100.0000 mL | Freq: Once | INTRAMUSCULAR | Status: AC | PRN
  Administered 2013-03-14: 100 mL via INTRAVENOUS

## 2013-03-14 MED ORDER — IOHEXOL 300 MG/ML  SOLN
50.0000 mL | Freq: Once | INTRAMUSCULAR | Status: AC | PRN
  Administered 2013-03-14: 50 mL via ORAL

## 2013-03-14 MED ORDER — MORPHINE SULFATE 4 MG/ML IJ SOLN
4.0000 mg | Freq: Once | INTRAMUSCULAR | Status: AC
  Administered 2013-03-14: 4 mg via INTRAVENOUS
  Filled 2013-03-14: qty 1

## 2013-03-14 MED ORDER — OMEPRAZOLE 20 MG PO CPDR: 20.0000 mg | DELAYED_RELEASE_CAPSULE | Freq: Every day | ORAL | Status: DC

## 2013-03-14 MED ORDER — ONDANSETRON HCL 4 MG PO TABS: 4.0000 mg | ORAL_TABLET | Freq: Four times a day (QID) | ORAL | Status: DC

## 2013-03-14 MED ORDER — SODIUM CHLORIDE 0.9 % IV BOLUS (SEPSIS)
1000.0000 mL | Freq: Once | INTRAVENOUS | Status: AC
  Administered 2013-03-14: 1000 mL via INTRAVENOUS

## 2013-03-14 NOTE — ED Notes (Signed)
Pt tolerated a cup of ginger ale said he felt like its going to stay down.

## 2013-03-14 NOTE — ED Provider Notes (Signed)
CSN: [id number]     Arrival date & time 03/14/13  1706 History   First MD Initiated Contact with Patient 03/14/13 1731     Chief Complaint  Patient presents with  . Emesis   (Consider location/radiation/quality/duration/timing/severity/associated sxs/prior Treatment) HPI Comments: Patient presents with a one month history of upper abdominal pain associated with nausea and vomiting that is worse in the past 4 or 5 days. He describes his emesis is dark colored but denies seeing any blood. He is not in acute pain now for the past 4 days and feels constipated. He's had a previous appendectomy. He was seen in urgent care and was worried about his gallbladder. Denies any fever. Denies any dysuria or hematuria. No blood in his stool. No testicle pain. Nothing makes the pain better or worse.  The history is provided by the patient.    Past Medical History  Diagnosis Date  . Disc disorder   . Thyroid disease   . High cholesterol   . Bipolar disorder   . Depression    Past Surgical History  Procedure Laterality Date  . Appendectomy    . Carpal tunnel release    . Back surgery     History reviewed. No pertinent family history. History  Substance Use Topics  . Smoking status: Never Smoker   . Smokeless tobacco: Not on file  . Alcohol Use: No    Review of Systems  Constitutional: Positive for activity change, appetite change and fatigue. Negative for fever.  HENT: Negative for ear discharge.   Eyes: Negative for visual disturbance.  Respiratory: Negative for cough, chest tightness and shortness of breath.   Cardiovascular: Negative for chest pain.  Gastrointestinal: Positive for nausea and vomiting. Negative for abdominal pain and diarrhea.  Genitourinary: Negative for dysuria, hematuria and testicular pain.  Musculoskeletal: Negative for back pain.  Skin: Negative for rash.  Neurological: Negative for dizziness, weakness and headaches.  A complete 10 system review of systems was  obtained and all systems are negative except as noted in the HPI and PMH.     Allergies  Review of patient's allergies indicates no known allergies.  Home Medications   Current Outpatient Rx  Name  Route  Sig  Dispense  Refill  . clorazepate (TRANXENE) 15 MG tablet   Oral   Take 15 mg by mouth 2 (two) times daily.         Marland Kitchen FLUoxetine (PROZAC) 40 MG capsule   Oral   Take 40 mg by mouth daily.         Marland Kitchen HYDROcodone-acetaminophen (NORCO) 10-325 MG per tablet   Oral   Take 1 tablet by mouth daily as needed for moderate pain or severe pain.         Marland Kitchen levothyroxine (SYNTHROID, LEVOTHROID) 75 MCG tablet   Oral   Take 75 mcg by mouth daily before breakfast.         . meloxicam (MOBIC) 7.5 MG tablet   Oral   Take 1 tablet (7.5 mg total) by mouth daily.   6 tablet   0   . OLANZapine (ZYPREXA) 15 MG tablet   Oral   Take 15 mg by mouth at bedtime.         . ondansetron (ZOFRAN-ODT) 8 MG disintegrating tablet   Oral   Take 1 tablet by mouth every 4 (four) hours as needed. For nausea and/or vomiting         . pravastatin (PRAVACHOL) 40 MG tablet   Oral  Take 80 mg by mouth at bedtime.         Marland Kitchen tiZANidine (ZANAFLEX) 4 MG capsule   Oral   Take 4 mg by mouth 3 (three) times daily as needed. For muscle spasms         . zolpidem (AMBIEN) 10 MG tablet   Oral   Take 10 mg by mouth at bedtime as needed. For sleep         . HYDROcodone-acetaminophen (NORCO/VICODIN) 5-325 MG per tablet   Oral   Take 2 tablets by mouth every 4 (four) hours as needed.   10 tablet   0   . omeprazole (PRILOSEC) 20 MG capsule   Oral   Take 1 capsule (20 mg total) by mouth daily.   30 capsule   0   . ondansetron (ZOFRAN) 4 MG tablet   Oral   Take 1 tablet (4 mg total) by mouth every 6 (six) hours.   12 tablet   0    BP 129/74  Pulse 85  Temp(Src) 98.1 F (36.7 C) (Oral)  Resp 20  Ht 6\' 1"  (1.854 m)  Wt 219 lb (99.338 kg)  BMI 28.90 kg/m2  SpO2 99% Physical Exam   Constitutional: He is oriented to person, place, and time. He appears well-developed and well-nourished. No distress.  HENT:  Head: Normocephalic and atraumatic.  Mouth/Throat: Oropharynx is clear and moist. No oropharyngeal exudate.  Eyes: Conjunctivae and EOM are normal. Pupils are equal, round, and reactive to light.  Neck: Normal range of motion. Neck supple.  Cardiovascular: Normal rate, regular rhythm and normal heart sounds.   No murmur heard. Pulmonary/Chest: Effort normal and breath sounds normal. No respiratory distress.  Abdominal: Soft. There is tenderness. There is no rebound and no guarding.  Epigastric tenderness to palpation. Negative Murphy's sign. No pain at Mcburney's point Abdomen soft and nontender.  Genitourinary: Guaiac negative stool.  No no hemorrhoids  Musculoskeletal: Normal range of motion. He exhibits no edema and no tenderness.  Neurological: He is alert and oriented to person, place, and time. No cranial nerve deficit. He exhibits normal muscle tone. Coordination normal.  Skin: Skin is warm.    ED Course  Procedures (including critical care time) Labs Review Labs Reviewed  COMPREHENSIVE METABOLIC PANEL - Abnormal; Notable for the following:    Glucose, Bld 104 (*)    GFR calc non Af Amer 73 (*)    GFR calc Af Amer 85 (*)    All other components within normal limits  URINALYSIS, ROUTINE W REFLEX MICROSCOPIC - Abnormal; Notable for the following:    Color, Urine AMBER (*)    pH >9.0 (*)    Bilirubin Urine SMALL (*)    Ketones, ur >80 (*)    All other components within normal limits  OCCULT BLOOD GASTRIC / DUODENUM (SPECIMEN CUP) - Abnormal; Notable for the following:    Occult Blood, Gastric NEGATIVE (*)    All other components within normal limits  CBC WITH DIFFERENTIAL  LIPASE, BLOOD  LACTIC ACID, PLASMA  POCT GASTRIC OCCULT BLOOD (1-CARD TO LAB)   Imaging Review Ct Abdomen Pelvis W Contrast  03/14/2013   CLINICAL DATA:  Abdominal pain.   Vomiting.  Previous appendectomy.  EXAM: CT ABDOMEN AND PELVIS WITH CONTRAST  TECHNIQUE: Multidetector CT imaging of the abdomen and pelvis was performed using the standard protocol following bolus administration of intravenous contrast.  CONTRAST:  OMNIPAQUE IOHEXOL 300 MG/ML  SOLN  COMPARISON:  07/28/2008  FINDINGS: The  liver, gallbladder, pancreas, spleen, adrenal glands, and kidneys are normal in appearance except for a stable tiny left renal cyst. No evidence of hydronephrosis. No soft tissue masses or lymphadenopathy identified within the abdomen or pelvis.  Mild low-attenuation wall thickening is seen involving the gastric antrum, suspicious for gastritis. There is no evidence of free air or abscess. No other inflammatory process identified. No evidence of bowel wall thickening, dilatation, or hernia.  IMPRESSION: Mild wall thickening of the gastric antrum, suspicious for gastritis. No evidence of free air. Consider endoscopy for further evaluation.   Electronically Signed   By: Myles Rosenthal M.D.   On: 03/14/2013 20:33    EKG Interpretation   None       MDM   1. Gastritis    Upper abdominal pain with nausea and vomiting for the past 4 or 5 days. No fever, diarrhea, chest pain or shortness of breath.  No right upper quadrant pain. Labs unremarkable. NG tube was placed given patient's description of dark emesis. This was Hemoccult negative and did not appear to be coffee grounds. Fecal occult blood negative. No evidence of GI bleed. CT findings consistent with gastritis. Advised to refrain from alcohol, caffeine, NSAIDs. Patient tolerating by mouth in the ED. No vomiting. He'll be started on Zofran and PPI. Refer to gastroenterology for endoscopy.   Glynn Octave, MD 03/14/13 [phone]

## 2013-03-14 NOTE — ED Notes (Signed)
poct hemoccult blood stool negative.

## 2013-03-14 NOTE — ED Notes (Addendum)
abd pain, vomiting, Sent from [facility].  Decreased intake for 1 month.  Says his emesis is dark.

## 2013-04-23 ENCOUNTER — Other Ambulatory Visit: Payer: Self-pay | Admitting: Internal Medicine

## 2013-04-23 ENCOUNTER — Encounter: Payer: Self-pay | Admitting: Gastroenterology

## 2013-04-23 ENCOUNTER — Encounter (INDEPENDENT_AMBULATORY_CARE_PROVIDER_SITE_OTHER): Payer: Self-pay

## 2013-04-23 ENCOUNTER — Ambulatory Visit (INDEPENDENT_AMBULATORY_CARE_PROVIDER_SITE_OTHER): Payer: BC Managed Care – PPO | Admitting: Gastroenterology

## 2013-04-23 VITALS — BP 110/72 | HR 80 | Temp 97.5°F | Wt 242.6 lb

## 2013-04-23 DIAGNOSIS — K2990 Gastroduodenitis, unspecified, without bleeding: Secondary | ICD-10-CM

## 2013-04-23 DIAGNOSIS — Z860100 Personal history of colon polyps, unspecified: Secondary | ICD-10-CM | POA: Insufficient documentation

## 2013-04-23 DIAGNOSIS — K2970 Gastritis, unspecified, without bleeding: Secondary | ICD-10-CM

## 2013-04-23 DIAGNOSIS — R1013 Epigastric pain: Secondary | ICD-10-CM

## 2013-04-23 MED ORDER — PEG 3350-KCL-NA BICARB-NACL 420 G PO SOLR: 4000.0000 mL | ORAL | Status: DC

## 2013-04-23 NOTE — Patient Instructions (Addendum)
1. Continue omeprazole 20 mg before breakfast daily. 2. Colonoscopy and upper endoscopy as scheduled. Please see separate instructions.  Gastritis, Adult Gastritis is soreness and puffiness (inflammation) of the lining of the stomach. If you do not get help, gastritis can cause bleeding and sores (ulcers) in the stomach. HOME CARE   Only take medicine as told by your doctor.  If you were given antibiotic medicines, take them as told. Finish the medicines even if you start to feel better.  Drink enough fluids to keep your pee (urine) clear or pale yellow.  Avoid foods and drinks that make your problems worse. Foods you may want to avoid include:  Caffeine or alcohol.  Chocolate.  Mint.  Garlic and onions.  Spicy foods.  Citrus fruits, including oranges, lemons, or limes.  Food containing tomatoes, including sauce, chili, salsa, and pizza.  Fried and fatty foods.  Eat small meals throughout the day instead of large meals. GET HELP RIGHT AWAY IF:   You have black or dark red poop (stools).  You throw up (vomit) blood. It may look like coffee grounds.  You cannot keep fluids down.  Your belly (abdominal) pain gets worse.  You have a fever.  You do not feel better after 1 week.  You have any other questions or concerns. MAKE SURE YOU:   Understand these instructions.  Will watch your condition.  Will get help right away if you are not doing well or get worse. Document Released: 09/06/2007 Document Revised: 06/12/2011 Document Reviewed: 05/03/2011 [facility] Patient Information 2014 [place].

## 2013-04-23 NOTE — Assessment & Plan Note (Signed)
Patient gives history of colon polyps. States he was supposed to followup a couple years ago for colonoscopy but did not. He would like to have surveillance colonoscopy at this time. Plan for deep sedation given polypharmacy and history of inadequate sedation in the past.  I have discussed the risks, alternatives, benefits with regards to but not limited to the risk of reaction to medication, bleeding, infection, perforation and the patient is agreeable to proceed. Written consent to be obtained.

## 2013-04-23 NOTE — Progress Notes (Signed)
Primary Care Physician:  Netta Cedars, MD  Primary Gastroenterologist:  Garfield Cornea, MD   Chief Complaint  Patient presents with  . Abdominal Pain    HPI:  Anthony Weaver is a 38 y.o. male here for further evaluation of abdominal pain, nausea vomiting. He was seen in the emergency department at [facility] about 4 weeks ago after several day history of N/V and abdominal pain. Seen at [facility] initially and given Zofran for possible gastroenteritis. But after persistent symptoms he went to the ER. CT showed probable gastritis. He was given omeprazole. Started feeling better but if doesn't take it he has recurrent symptoms. Postprandially epigastric pain. Has h/o colon polyps on TCS about 7 years ago. TCS was done for diarrhea. Was supposed to follow-up but didn't do.   No heartburn. Generally tolerates most foods except bananas. Some postprandially diarrhea especially with salad bars. Some regurgitation. No dysphagia. No hematemesis. BM 1 per day. Occasionally skip a day. No melena, brbpr.   Takes occasional Goody's. May take couple of times per month.   Current Outpatient Prescriptions  Medication Sig Dispense Refill  . FLUoxetine (PROZAC) 40 MG capsule Take 40 mg by mouth daily.      Marland Kitchen HYDROcodone-acetaminophen (NORCO) 10-325 MG per tablet Take 1 tablet by mouth daily as needed for moderate pain or severe pain.      Marland Kitchen levothyroxine (SYNTHROID, LEVOTHROID) 75 MCG tablet Take 75 mcg by mouth daily before breakfast.      . meloxicam (MOBIC) 7.5 MG tablet Take 15 mg by mouth daily.      Marland Kitchen OLANZapine (ZYPREXA) 15 MG tablet Take 15 mg by mouth at bedtime.      Marland Kitchen omeprazole (PRILOSEC) 20 MG capsule Take 1 capsule (20 mg total) by mouth daily.  30 capsule  0  . ondansetron (ZOFRAN) 4 MG tablet Take 1 tablet (4 mg total) by mouth every 6 (six) hours.  12 tablet  0  . pravastatin (PRAVACHOL) 40 MG tablet Take 80 mg by mouth at bedtime.      Marland Kitchen tiZANidine (ZANAFLEX) 4 MG capsule Take 4  mg by mouth 3 (three) times daily as needed. For muscle spasms      . zolpidem (AMBIEN) 10 MG tablet Take 10 mg by mouth at bedtime as needed. For sleep       No current facility-administered medications for this visit.    Allergies as of 04/23/2013  . (No Known Allergies)    Past Medical History  Diagnosis Date  . Disc disorder   . Thyroid disease     overactive initially, now underactive  . High cholesterol   . Bipolar disorder   . Depression     Past Surgical History  Procedure Laterality Date  . Appendectomy    . Carpal tunnel release    . Back surgery    . Colonoscopy      Dr. Mady Gemma 7 yrs ago/had polyps  . Breast lumpectomy      left breast/gynecamastia    Family History  Problem Relation Age of Onset  . Crohn's disease Father     ileostomy    History   Social History  . Marital Status: Married    Spouse Name: N/A    Number of Children: N/A  . Years of Education: N/A   Occupational History  . Not on file.   Social History Main Topics  . Smoking status: Never Smoker   . Smokeless tobacco: Not on file  . Alcohol Use:  No  . Drug Use: No  . Sexual Activity: Not on file   Other Topics Concern  . Not on file   Social History Narrative  . No narrative on file      ROS:  General: Negative for anorexia, fever, chills, fatigue, weakness. Denies weight loss. Eyes: Negative for vision changes.  ENT: Negative for hoarseness, difficulty swallowing , nasal congestion. CV: Negative for chest pain, angina, palpitations, dyspnea on exertion, peripheral edema.  Respiratory: Negative for dyspnea at rest, dyspnea on exertion, cough, sputum, wheezing.  GI: See history of present illness. GU:  Negative for dysuria, hematuria, urinary incontinence, urinary frequency, nocturnal urination.  MS: Negative for joint pain. Chronic low back pain.  Derm: Negative for rash or itching.  Neuro: Negative for weakness, abnormal sensation, seizure, frequent headaches,  memory loss, confusion.  Psych: Negative for anxiety, depression, suicidal ideation, hallucinations.  Endo: Negative for unusual weight change.  Heme: Negative for bruising or bleeding. Allergy: Negative for rash or hives.    Physical Examination:  BP 110/72  Pulse 80  Temp(Src) 97.5 F (36.4 C) (Oral)  Wt 242 lb 9.6 oz (110.043 kg)   General: Well-nourished, well-developed in no acute distress. Accompanied by wife. Head: Normocephalic, atraumatic.   Eyes: Conjunctiva pink, no icterus. Mouth: Oropharyngeal mucosa moist and pink , no lesions erythema or exudate. Neck: Supple without thyromegaly, masses, or lymphadenopathy.  Lungs: Clear to auscultation bilaterally.  Heart: Regular rate and rhythm, no murmurs rubs or gallops.  Abdomen: Bowel sounds are normal, nontender, nondistended, no hepatosplenomegaly or masses, no abdominal bruits or    hernia , no rebound or guarding.   Rectal: Not performed Extremities: No lower extremity edema. No clubbing or deformities.  Neuro: Alert and oriented x 4 , grossly normal neurologically.  Skin: Warm and dry, no rash or jaundice.   Psych: Alert and cooperative, normal mood and affect.  Labs: Lab Results  Component Value Date   WBC 8.7 03/14/2013   HGB 15.0 03/14/2013   HCT 44.7 03/14/2013   MCV 90.9 03/14/2013   PLT 214 03/14/2013   Lab Results  Component Value Date   CREATININE 1.22 03/14/2013   BUN 18 03/14/2013   NA 139 03/14/2013   K 3.6 03/14/2013   CL 97 03/14/2013   CO2 30 03/14/2013   Lab Results  Component Value Date   ALT 10 03/14/2013   AST 8 03/14/2013   ALKPHOS 62 03/14/2013   BILITOT 0.5 03/14/2013   Lab Results  Component Value Date   LIPASE 13 03/14/2013     Imaging Studies: CT abdomen and pelvis on 03/14/2013 Impression:  Mild wall thickening of the gastric antrum.    

## 2013-04-23 NOTE — Assessment & Plan Note (Signed)
38 year old with a several day history of nausea/vomiting, epigastric pain resulting in urgent care and ER visit. CT suggests gastritis. He has been doing better on omeprazole but if he misses the medication he has recurrent symptoms. Takes Mobic daily and intermittent Aleve and Goody's powders. Suspect NSAID-related gastritis. Recommend upper endoscopy for further evaluation of symptoms as well as to exclude ulcers. Given polypharmacy and history of inadequate conscious sedation in the past, would pursue deep sedation in the OR.  I have discussed the risks, alternatives, benefits with regards to but not limited to the risk of reaction to medication, bleeding, infection, perforation and the patient is agreeable to proceed. Written consent to be obtained.  Continue omeprazole 20mg  daily.

## 2013-04-23 NOTE — Progress Notes (Signed)
cc'd to pcp 

## 2013-04-29 ENCOUNTER — Encounter (HOSPITAL_COMMUNITY): Payer: Self-pay

## 2013-04-29 ENCOUNTER — Encounter (HOSPITAL_COMMUNITY): Payer: Self-pay | Admitting: Pharmacy Technician

## 2013-04-29 ENCOUNTER — Encounter (HOSPITAL_COMMUNITY)
Admission: RE | Admit: 2013-04-29 | Discharge: 2013-04-29 | Disposition: A | Discharge status: Home / Self Care | Payer: BC Managed Care – PPO | Source: Ambulatory Visit | Attending: Internal Medicine | Admitting: Internal Medicine

## 2013-04-29 DIAGNOSIS — Z01812 Encounter for preprocedural laboratory examination: Secondary | ICD-10-CM | POA: Insufficient documentation

## 2013-04-29 DIAGNOSIS — Z01818 Encounter for other preprocedural examination: Secondary | ICD-10-CM | POA: Insufficient documentation

## 2013-04-29 HISTORY — DX: Hypothyroidism, unspecified: E03.9

## 2013-04-29 HISTORY — DX: Dorsalgia, unspecified: M54.9

## 2013-04-29 HISTORY — DX: Other chronic pain: G89.29

## 2013-04-29 HISTORY — DX: Unspecified osteoarthritis, unspecified site: M19.90

## 2013-04-29 HISTORY — DX: Sleep apnea, unspecified: G47.30

## 2013-04-29 LAB — BASIC METABOLIC PANEL
BUN: 20 mg/dL (ref 6–23)
CALCIUM: 9.9 mg/dL (ref 8.4–10.5)
CO2: 25 mEq/L (ref 19–32)
Chloride: 102 mEq/L (ref 96–112)
Creatinine, Ser: 0.87 mg/dL (ref 0.50–1.35)
GFR calc Af Amer: >90 mL/min (ref >90)
GLUCOSE: 132 mg/dL — AB (ref 70–99)
Potassium: 4.1 mEq/L (ref 3.7–5.3)
SODIUM: 142 meq/L (ref 137–147)

## 2013-04-29 LAB — HEMOGLOBIN AND HEMATOCRIT, BLOOD
HEMATOCRIT: 42.4 % (ref 39.0–52.0)
Hemoglobin: 14.7 g/dL (ref 13.0–17.0)

## 2013-04-29 NOTE — Patient Instructions (Signed)
Anthony Weaver  04/29/2013   Your procedure is scheduled on:   05/08/2013  Report to [facility] at  38  AM.  Call this number if you have problems the morning of surgery: [phone]   Remember:   Do not eat food or drink liquids after midnight.   Take these medicines the morning of surgery with A SIP OF WATER:  Prozac, norco, synthroid,prilosec, zofran, zanaflex   Do not wear jewelry, make-up or nail polish.  Do not wear lotions, powders, or perfumes.   Do not shave 48 hours prior to surgery. Men may shave face and neck.  Do not bring valuables to the hospital.  [facility] is not responsible for any belongings or valuables.               Contacts, dentures or bridgework may not be worn into surgery.  Leave suitcase in the car. After surgery it may be brought to your room.  For patients admitted to the hospital, discharge time is determined by your treatment team.               Patients discharged the day of surgery will not be allowed to drive home.  Name and phone number of your driver: family  Special Instructions: N/A   Please read over the following fact sheets that you were given: Pain Booklet, Coughing and Deep Breathing, Surgical Site Infection Prevention, Anesthesia Post-op Instructions and Care and Recovery After Surgery Colonoscopy A colonoscopy is an exam to look at the entire large intestine (colon). This exam can help find problems such as tumors, polyps, inflammation, and areas of bleeding. The exam takes about 1 hour.  LET [facility] CARE PROVIDER KNOW ABOUT:   Any allergies you have.  All medicines you are taking, including vitamins, herbs, eye drops, creams, and over-the-counter medicines.  Previous problems you or members of your family have had with the use of anesthetics.  Any blood disorders you have.  Previous surgeries you have had.  Medical conditions you have. RISKS AND COMPLICATIONS  Generally, this is a safe procedure. However, as  with any procedure, complications can occur. Possible complications include:  Bleeding.  Tearing or rupture of the colon wall.  Reaction to medicines given during the exam.  Infection (rare). BEFORE THE PROCEDURE   Ask your health care provider about changing or stopping your regular medicines.  You may be prescribed an oral bowel prep. This involves drinking a large amount of medicated liquid, starting the day before your procedure. The liquid will cause you to have multiple loose stools until your stool is almost clear or light green. This cleans out your colon in preparation for the procedure.  Do not eat or drink anything else once you have started the bowel prep, unless your health care provider tells you it is safe to do so.  Arrange for someone to drive you home after the procedure. PROCEDURE   You will be given medicine to help you relax (sedative).  You will lie on your side with your knees bent.  A long, flexible tube with a light and camera on the end (colonoscope) will be inserted through the rectum and into the colon. The camera sends video back to a computer screen as it moves through the colon. The colonoscope also releases carbon dioxide gas to inflate the colon. This helps your health care provider see the area better.  During the exam, your health care provider may take  a small tissue sample (biopsy) to be examined under a microscope if any abnormalities are found.  The exam is finished when the entire colon has been viewed. AFTER THE PROCEDURE   Do not drive for 24 hours after the exam.  You may have a small amount of blood in your stool.  You may pass moderate amounts of gas and have mild abdominal cramping or bloating. This is caused by the gas used to inflate your colon during the exam.  Ask when your test results will be ready and how you will get your results. Make sure you get your test results. Document Released: 03/17/2000 Document Revised: 01/08/2013  Document Reviewed: 11/25/2012 [facility] Patient Information 2014 [place]. Esophagogastroduodenoscopy Esophagogastroduodenoscopy (EGD) is a procedure to examine the lining of the esophagus, stomach, and first part of the small intestine (duodenum). A long, flexible, lighted tube with a camera attached (endoscope) is inserted down the throat to view these organs. This procedure is done to detect problems or abnormalities, such as inflammation, bleeding, ulcers, or growths, in order to treat them. The procedure lasts about 5 20 minutes. It is usually an outpatient procedure, but it may need to be performed in emergency cases in the hospital. LET YOUR CAREGIVER KNOW ABOUT:   Allergies to food or medicine.  All medicines you are taking, including vitamins, herbs, eyedrops, and over-the-counter medicines and creams.  Use of steroids (by mouth or creams).  Previous problems you or members of your family have had with the use of anesthetics.  Any blood disorders you have.  Previous surgeries you have had.  Other health problems you have.  Possibility of pregnancy, if this applies. RISKS AND COMPLICATIONS  Generally, EGD is a safe procedure. However, as with any procedure, complications can occur. Possible complications include:  Infection.  Bleeding.  Tearing (perforation) of the esophagus, stomach, or duodenum.  Difficulty breathing or not being able to breath.  Excessive sweating.  Spasms of the larynx.  Slowed heartbeat.  Low blood pressure. BEFORE THE PROCEDURE  Do not eat or drink anything for 6 8 hours before the procedure or as directed by your caregiver.  Ask your caregiver about changing or stopping your regular medicines.  If you wear dentures, be prepared to remove them before the procedure.  Arrange for someone to drive you home after the procedure. PROCEDURE   A vein will be accessed to give medicines and fluids. A medicine to relax you (sedative) and a  pain reliever will be given through that access into the vein.  A numbing medicine (local anesthetic) may be sprayed on your throat for comfort and to stop you from gagging or coughing.  A mouth guard may be placed in your mouth to protect your teeth and to keep you from biting on the endoscope.  You will be asked to lie on your left side.  The endoscope is inserted down your throat and into the esophagus, stomach, and duodenum.  Air is put through the endoscope to allow your caregiver to view the lining of your esophagus clearly.  The esophagus, stomach, and duodenum is then examined. During the exam, your caregiver may:  Remove tissue to be examined under a microscope (biopsy) for inflammation, infection, or other medical problems.  Remove growths.  Remove objects (foreign bodies) that are stuck.  Treat any bleeding with medicines or other devices that stop tissues from bleeding (hot cauters, clipping devices).  Widen (dilate) or stretch narrowed areas of the esophagus and stomach.  The  endoscope will then be withdrawn. AFTER THE PROCEDURE  You will be taken to a recovery area to be monitored. You will be able to go home once you are stable and alert.  Do not eat or drink anything until the local anesthetic and numbing medicines have worn off. You may choke.  It is normal to feel bloated, have pain with swallowing, or have a sore throat for a short time. This will wear off.  Your caregiver should be able to discuss his or her findings with you. It will take longer to discuss the test results if any biopsies were taken. Document Released: 07/21/2004 Document Revised: 03/06/2012 Document Reviewed: 02/21/2012 [facility] Patient Information 2014 [place], [place]. PATIENT INSTRUCTIONS POST-ANESTHESIA  IMMEDIATELY FOLLOWING SURGERY:  Do not drive or operate machinery for the first twenty four hours after surgery.  Do not make any important decisions for twenty four hours after surgery  or while taking narcotic pain medications or sedatives.  If you develop intractable nausea and vomiting or a severe headache please notify your doctor immediately.  FOLLOW-UP:  Please make an appointment with your surgeon as instructed. You do not need to follow up with anesthesia unless specifically instructed to do so.  WOUND CARE INSTRUCTIONS (if applicable):  Keep a dry clean dressing on the anesthesia/puncture wound site if there is drainage.  Once the wound has quit draining you may leave it open to air.  Generally you should leave the bandage intact for twenty four hours unless there is drainage.  If the epidural site drains for more than 36-48 hours please call the anesthesia department.  QUESTIONS?:  Please feel free to call your physician or the hospital operator if you have any questions, and they will be happy to assist you.

## 2013-05-08 ENCOUNTER — Encounter (HOSPITAL_COMMUNITY): Payer: BC Managed Care – PPO | Admitting: Anesthesiology

## 2013-05-08 ENCOUNTER — Ambulatory Visit (HOSPITAL_COMMUNITY)
Admission: RE | Admit: 2013-05-08 | Discharge: 2013-05-08 | Disposition: A | Discharge status: Home / Self Care | Payer: BC Managed Care – PPO | Source: Ambulatory Visit | Attending: Internal Medicine | Admitting: Internal Medicine

## 2013-05-08 ENCOUNTER — Encounter (HOSPITAL_COMMUNITY)
Admission: RE | Disposition: A | Discharge status: Home / Self Care | Payer: Self-pay | Source: Ambulatory Visit | Attending: Internal Medicine

## 2013-05-08 ENCOUNTER — Encounter (HOSPITAL_COMMUNITY): Payer: Self-pay

## 2013-05-08 ENCOUNTER — Ambulatory Visit (HOSPITAL_COMMUNITY): Payer: BC Managed Care – PPO | Admitting: Anesthesiology

## 2013-05-08 DIAGNOSIS — K259 Gastric ulcer, unspecified as acute or chronic, without hemorrhage or perforation: Secondary | ICD-10-CM | POA: Insufficient documentation

## 2013-05-08 DIAGNOSIS — D126 Benign neoplasm of colon, unspecified: Secondary | ICD-10-CM | POA: Insufficient documentation

## 2013-05-08 DIAGNOSIS — Z860100 Personal history of colon polyps, unspecified: Secondary | ICD-10-CM

## 2013-05-08 DIAGNOSIS — F3289 Other specified depressive episodes: Secondary | ICD-10-CM | POA: Insufficient documentation

## 2013-05-08 DIAGNOSIS — K648 Other hemorrhoids: Secondary | ICD-10-CM | POA: Insufficient documentation

## 2013-05-08 DIAGNOSIS — K2990 Gastroduodenitis, unspecified, without bleeding: Secondary | ICD-10-CM

## 2013-05-08 DIAGNOSIS — K2900 Acute gastritis without bleeding: Secondary | ICD-10-CM | POA: Insufficient documentation

## 2013-05-08 DIAGNOSIS — E7800 Pure hypercholesterolemia, unspecified: Secondary | ICD-10-CM | POA: Insufficient documentation

## 2013-05-08 DIAGNOSIS — K2970 Gastritis, unspecified, without bleeding: Secondary | ICD-10-CM

## 2013-05-08 DIAGNOSIS — F329 Major depressive disorder, single episode, unspecified: Secondary | ICD-10-CM | POA: Insufficient documentation

## 2013-05-08 HISTORY — PX: ESOPHAGOGASTRODUODENOSCOPY (EGD) WITH PROPOFOL: SHX5813

## 2013-05-08 HISTORY — PX: POLYPECTOMY: SHX5525

## 2013-05-08 HISTORY — PX: BIOPSY: SHX5522

## 2013-05-08 HISTORY — PX: COLONOSCOPY WITH PROPOFOL: SHX5780

## 2013-05-08 SURGERY — COLONOSCOPY WITH PROPOFOL
Anesthesia: Monitor Anesthesia Care

## 2013-05-08 MED ORDER — MIDAZOLAM HCL 2 MG/2ML IJ SOLN
INTRAMUSCULAR | Status: AC
  Filled 2013-05-08: qty 2

## 2013-05-08 MED ORDER — PROPOFOL 10 MG/ML IV EMUL
INTRAVENOUS | Status: AC
  Filled 2013-05-08: qty 20

## 2013-05-08 MED ORDER — ARTIFICIAL TEARS OP OINT
TOPICAL_OINTMENT | OPHTHALMIC | Status: AC
  Filled 2013-05-08: qty 3.5

## 2013-05-08 MED ORDER — MIDAZOLAM HCL 5 MG/5ML IJ SOLN
INTRAMUSCULAR | Status: DC | PRN
  Administered 2013-05-08: 2 mg via INTRAVENOUS

## 2013-05-08 MED ORDER — LACTATED RINGERS IV SOLN
INTRAVENOUS | Status: DC | PRN
  Administered 2013-05-08 (×2): via INTRAVENOUS

## 2013-05-08 MED ORDER — PROPOFOL INFUSION 10 MG/ML OPTIME
INTRAVENOUS | Status: DC | PRN
  Administered 2013-05-08: 75 ug/kg/min via INTRAVENOUS
  Administered 2013-05-08: 10:00:00 via INTRAVENOUS

## 2013-05-08 MED ORDER — GLYCOPYRROLATE 0.2 MG/ML IJ SOLN
0.2000 mg | Freq: Once | INTRAMUSCULAR | Status: AC
  Administered 2013-05-08: 0.2 mg via INTRAVENOUS

## 2013-05-08 MED ORDER — ONDANSETRON HCL 4 MG/2ML IJ SOLN
INTRAMUSCULAR | Status: AC
  Filled 2013-05-08: qty 2

## 2013-05-08 MED ORDER — FENTANYL CITRATE 0.05 MG/ML IJ SOLN
INTRAMUSCULAR | Status: AC
  Filled 2013-05-08: qty 2

## 2013-05-08 MED ORDER — LACTATED RINGERS IV SOLN
INTRAVENOUS | Status: DC
  Administered 2013-05-08: 09:00:00 via INTRAVENOUS

## 2013-05-08 MED ORDER — STERILE WATER FOR IRRIGATION IR SOLN
Status: DC | PRN
  Administered 2013-05-08: 10:00:00

## 2013-05-08 MED ORDER — ONDANSETRON HCL 4 MG/2ML IJ SOLN: 4.0000 mg | Freq: Once | INTRAMUSCULAR | Status: DC | PRN

## 2013-05-08 MED ORDER — MIDAZOLAM HCL 2 MG/2ML IJ SOLN
1.0000 mg | INTRAMUSCULAR | Status: DC | PRN
  Administered 2013-05-08 (×2): 2 mg via INTRAVENOUS
  Filled 2013-05-08: qty 2

## 2013-05-08 MED ORDER — ONDANSETRON HCL 4 MG/2ML IJ SOLN
4.0000 mg | Freq: Once | INTRAMUSCULAR | Status: AC
  Administered 2013-05-08: 4 mg via INTRAVENOUS

## 2013-05-08 MED ORDER — FENTANYL CITRATE 0.05 MG/ML IJ SOLN
INTRAMUSCULAR | Status: DC | PRN
  Administered 2013-05-08 (×4): 25 ug via INTRAVENOUS

## 2013-05-08 MED ORDER — FENTANYL CITRATE 0.05 MG/ML IJ SOLN
25.0000 ug | INTRAMUSCULAR | Status: AC
  Administered 2013-05-08 (×2): 25 ug via INTRAVENOUS

## 2013-05-08 MED ORDER — GLYCOPYRROLATE 0.2 MG/ML IJ SOLN
INTRAMUSCULAR | Status: AC
  Filled 2013-05-08: qty 1

## 2013-05-08 MED ORDER — FENTANYL CITRATE 0.05 MG/ML IJ SOLN
25.0000 ug | INTRAMUSCULAR | Status: DC | PRN
  Administered 2013-05-08: 50 ug via INTRAVENOUS

## 2013-05-08 MED ORDER — LIDOCAINE HCL (CARDIAC) 10 MG/ML IV SOLN
INTRAVENOUS | Status: DC | PRN
  Administered 2013-05-08: 50 mg via INTRAVENOUS

## 2013-05-08 MED ORDER — BUTAMBEN-TETRACAINE-BENZOCAINE 2-2-14 % EX AERO
1.0000 | INHALATION_SPRAY | Freq: Once | CUTANEOUS | Status: AC
  Administered 2013-05-08: 1 via TOPICAL
  Filled 2013-05-08: qty 56

## 2013-05-08 MED ORDER — FENTANYL CITRATE 0.05 MG/ML IJ SOLN
INTRAMUSCULAR | Status: AC
  Administered 2013-05-08: 50 ug via INTRAVENOUS
  Filled 2013-05-08: qty 2

## 2013-05-08 SURGICAL SUPPLY — 16 items
BLOCK BITE 60FR ADLT L/F BLUE (MISCELLANEOUS) ×4 IMPLANT
ELECT REM PT RETURN 9FT ADLT (ELECTROSURGICAL)
ELECTRODE REM PT RTRN 9FT ADLT (ELECTROSURGICAL) IMPLANT
FLOOR PAD 36X40 (MISCELLANEOUS) ×4
FORCEPS BIOP RAD 4 LRG CAP 4 (CUTTING FORCEPS) ×4 IMPLANT
FORMALIN 10 PREFIL 20ML (MISCELLANEOUS) ×4 IMPLANT
KIT CLEAN ENDO COMPLIANCE (KITS) ×4 IMPLANT
LUBRICANT JELLY 4.5OZ STERILE (MISCELLANEOUS) ×4 IMPLANT
MANIFOLD NEPTUNE II (INSTRUMENTS) ×4 IMPLANT
PAD FLOOR 36X40 (MISCELLANEOUS) ×2 IMPLANT
SNARE ROTATE MED OVAL 20MM (MISCELLANEOUS) ×4 IMPLANT
SYR 50ML LL SCALE MARK (SYRINGE) ×4 IMPLANT
TRAP SPECIMEN MUCOUS 40CC (MISCELLANEOUS) IMPLANT
TUBING ENDO SMARTCAP PENTAX (MISCELLANEOUS) ×4 IMPLANT
TUBING IRRIGATION ENDOGATOR (MISCELLANEOUS) ×4 IMPLANT
WATER STERILE IRR 1000ML POUR (IV SOLUTION) ×4 IMPLANT

## 2013-05-08 NOTE — Discharge Instructions (Addendum)
Colonoscopy Discharge Instructions  Read the instructions outlined below and refer to this sheet in the next few weeks. These discharge instructions provide you with general information on caring for yourself after you leave the hospital. Your doctor may also give you specific instructions. While your treatment has been planned according to the most current medical practices available, unavoidable complications occasionally occur. If you have any problems or questions after discharge, call Dr. Gala Romney at [phone]. ACTIVITY  You may resume your regular activity, but move at a slower pace for the next 24 hours.   Take frequent rest periods for the next 24 hours.   Walking will help get rid of the air and reduce the bloated feeling in your belly (abdomen).   No driving for 24 hours (because of the medicine (anesthesia) used during the test).    Do not sign any important legal documents or operate any machinery for 24 hours (because of the anesthesia used during the test).  NUTRITION  Drink plenty of fluids.   You may resume your normal diet as instructed by your doctor.   Begin with a light meal and progress to your normal diet. Heavy or fried foods are harder to digest and may make you feel sick to your stomach (nauseated).   Avoid alcoholic beverages for 24 hours or as instructed.  MEDICATIONS  You may resume your normal medications unless your doctor tells you otherwise.  WHAT YOU CAN EXPECT TODAY  Some feelings of bloating in the abdomen.   Passage of more gas than usual.   Spotting of blood in your stool or on the toilet paper.  IF YOU HAD POLYPS REMOVED DURING THE COLONOSCOPY:  No aspirin products for 7 days or as instructed.   No alcohol for 7 days or as instructed.   Eat a soft diet for the next 24 hours.  FINDING OUT THE RESULTS OF YOUR TEST Not all test results are available during your visit. If your test results are not back during the visit, make an appointment  with your caregiver to find out the results. Do not assume everything is normal if you have not heard from your caregiver or the medical facility. It is important for you to follow up on all of your test results.  SEEK IMMEDIATE MEDICAL ATTENTION IF:  You have more than a spotting of blood in your stool.   Your belly is swollen (abdominal distention).   You are nauseated or vomiting.   You have a temperature over 101.  You have abdominal pain or discomfort that is severe or gets worse throughout the day. EGD Discharge instructions Please read the instructions outlined below and refer to this sheet in the next few weeks. These discharge instructions provide you with general information on caring for yourself after you leave the hospital. Your doctor may also give you specific instructions. While your treatment has been planned according to the most current medical practices available, unavoidable complications occasionally occur. If you have any problems or questions after discharge, please call your doctor. ACTIVITY You may resume your regular activity but move at a slower pace for the next 24 hours.  Take frequent rest periods for the next 24 hours.  Walking will help expel (get rid of) the air and reduce the bloated feeling in your abdomen.  No driving for 24 hours (because of the anesthesia (medicine) used during the test).  You may shower.  Do not sign any important legal documents or operate any machinery for 24  hours (because of the anesthesia used during the test).  NUTRITION Drink plenty of fluids.  You may resume your normal diet.  Begin with a light meal and progress to your normal diet.  Avoid alcoholic beverages for 24 hours or as instructed by your caregiver.  MEDICATIONS You may resume your normal medications unless your caregiver tells you otherwise.  WHAT YOU CAN EXPECT TODAY You may experience abdominal discomfort such as a feeling of fullness or gas pains.   FOLLOW-UP Your doctor will discuss the results of your test with you.  SEEK IMMEDIATE MEDICAL ATTENTION IF ANY OF THE FOLLOWING OCCUR: Excessive nausea (feeling sick to your stomach) and/or vomiting.  Severe abdominal pain and distention (swelling).  Trouble swallowing.  Temperature over 101 F (37.8 C).  Rectal bleeding or vomiting of blood.    Information on polyp and peptic ulcer disease provided  Stop taking Mobic, Advil, Aleve and all nonsteroidal drugs (These medications are damaging your stomach and causing your symptoms).  Consult your primary care provider for pain medications that will be effective but safe on your stomach  Stop Prilosec; begin Protonix 40 mg twice daily  Further recommendations to follow pending review of pathology report  Office visit with [place] in 3 months.  May 11th at  11:30  Peptic Ulcer A peptic ulcer is a sore in the lining of in your esophagus (esophageal ulcer), stomach (gastric ulcer), or in the first part of your small intestine (duodenal ulcer). The ulcer causes erosion into the deeper tissue. CAUSES  Normally, the lining of the stomach and the small intestine protects itself from the acid that digests food. The protective lining can be damaged by:  An infection caused by a bacterium called Helicobacter pylori (H. pylori).  Regular use of nonsteroidal anti-inflammatory drugs (NSAIDs), such as ibuprofen or aspirin.  Smoking tobacco. Other risk factors include being older than 40, drinking alcohol excessively, and having a family history of ulcer disease.  SYMPTOMS   Burning pain or gnawing in the area between the chest and the belly button.  Heartburn.  Nausea and vomiting.  Bloating. The pain can be worse on an empty stomach and at night. If the ulcer results in bleeding, it can cause:  Black, tarry stools.  Vomiting of bright red blood.  Vomiting of coffee ground looking materials. DIAGNOSIS  A diagnosis is usually made based  upon your history and an exam. Other tests and procedures may be performed to find the cause of the ulcer. Finding a cause will help determine the best treatment. Tests and procedures may include:  Blood tests, stool tests, or breath tests to check for the bacterium H. pylori.  An upper gastrointestinal (GI) series of the esophagus, stomach, and small intestine.  An endoscopy to examine the esophagus, stomach, and small intestine.  A biopsy. TREATMENT  Treatment may include:  Eliminating the cause of the ulcer, such as smoking, NSAIDs, or alcohol.  Medicines to reduce the amount of acid in your digestive tract.  Antibiotic medicines if the ulcer is caused by the H. pylori bacterium.  An upper endoscopy to treat a bleeding ulcer.  Surgery if the bleeding is severe or if the ulcer created a hole somewhere in the digestive system. HOME CARE INSTRUCTIONS   Avoid tobacco, alcohol, and caffeine. Smoking can increase the acid in the stomach, and continued smoking will impair the healing of ulcers.  Avoid foods and drinks that seem to cause discomfort or aggravate your ulcer.  Only take medicines  as directed by your caregiver. Do not substitute over-the-counter medicines for prescription medicines without talking to your caregiver.  Keep any follow-up appointments and tests as directed. SEEK MEDICAL CARE IF:   Your do not improve within 7 days of starting treatment.  You have ongoing indigestion or heartburn. SEEK IMMEDIATE MEDICAL CARE IF:   You have sudden, sharp, or persistent abdominal pain.  You have bloody or dark black, tarry stools.  You vomit blood or vomit that looks like coffee grounds.  You become light headed, weak, or feel faint.  You become sweaty or clammy. MAKE SURE YOU:   Understand these instructions.  Will watch your condition.  Will get help right away if you are not doing well or get worse. Document Released: 03/17/2000 Document Revised: 12/13/2011  Document Reviewed: 10/18/2011 [facility] Patient Information 2014 [place].

## 2013-05-08 NOTE — Anesthesia Procedure Notes (Addendum)
Performed by: Hewitt Shorts A   Procedure Name: MAC Date/Time: 05/08/2013 9:32 AM Performed by: Andree Elk, AMY A Pre-anesthesia Checklist: Patient identified, Timeout performed, Emergency Drugs available, Suction available and Patient being monitored Oxygen Delivery Method: Simple face mask

## 2013-05-08 NOTE — Interval H&P Note (Signed)
History and Physical Interval Note:  05/08/2013 9:26 AM  Anthony Weaver  has presented today for surgery, with the diagnosis of history of colon polyps, epigastric pain and gastritis  The various methods of treatment have been discussed with the patient and family. After consideration of risks, benefits and other options for treatment, the patient has consented to  Procedure(s): COLONOSCOPY WITH PROPOFOL (N/A) ESOPHAGOGASTRODUODENOSCOPY (EGD) WITH PROPOFOL (N/A) as a surgical intervention .  The patient's history has been reviewed, patient examined, no change in status, stable for surgery.  I have reviewed the patient's chart and labs.  Questions were answered to the patient's satisfaction.     No change; EGD and colonoscopy per plan.  The risks, benefits, limitations, imponderables and alternatives regarding both EGD and colonoscopy have been reviewed with the patient. Questions have been answered. All parties agreeable.    Manus Rudd

## 2013-05-08 NOTE — Addendum Note (Signed)
Addendum created 05/08/13 1034 by Mickel Baas, CRNA   Modules edited: Anesthesia Medication Administration

## 2013-05-08 NOTE — Preoperative (Signed)
Beta Blockers   Reason not to administer Beta Blockers:Not Applicable 

## 2013-05-08 NOTE — Op Note (Signed)
[facility] [address] [place], [address]   ENDOSCOPY PROCEDURE REPORT  PATIENT: Anthony, Weaver  MR#: [id number] BIRTHDATE: 27-Apr-1975 , 38  yrs. old GENDER: Male ENDOSCOPIST: R.  Garfield Cornea, MD Quentin Ore REFERRED BY:       [facility] [place] PROCEDURE DATE:  05/08/2013 PROCEDURE:     EGD with gastric biopsy  INDICATIONS:     dyspepsia dependent on PPI therapy; NSAID use  INFORMED CONSENT:   The risks, benefits, limitations, alternatives and imponderables have been discussed.  The potential for biopsy, esophogeal dilation, etc. have also been reviewed.  Questions have been answered.  All parties agreeable.  Please see the history and physical in the medical record for more information.  MEDICATIONS:  deep sedation per Dr[facility]  DESCRIPTION OF PROCEDURE:   The     endoscope was introduced through the mouth and advanced to the second portion of the duodenum without difficulty or limitations.  The mucosal surfaces were surveyed very carefully during advancement of the scope and upon withdrawal.  Retroflexion view of the proximal stomach and esophagogastric junction was performed.      FINDINGS: Normal esophagus. Stomach empty. Deformity of the antrum/pyloric channel with a 1.2 cm deep benign-appearing elliptical ulcer. Please see above photos.  The remainder of the gastric mucosa appeared normal. Patent pylorus. Normal first and second portion of the duodenum  THERAPEUTIC / DIAGNOSTIC MANEUVERS PERFORMED:  The ulcer was biopsied.   COMPLICATIONS:  None  IMPRESSION:  Prepyloric gastric ulcer likely NSAID-related.  -likely cause of patient's pain  -  status post biopsy  RECOMMENDATIONS:   Refrain from taking all forms of nonsteroidal agents including Mobic. Stop Prilosec. Begin Protonix 40 mg twice daily. Followup on pathology. Office visit in 3 months to reassess and plan for followup  EGD.    _______________________________ R. Garfield Cornea, MD FACP [facility] eSigned:  R. Garfield Cornea, MD FACP [facility] 05/08/2013 10:52 AM     CC:

## 2013-05-08 NOTE — Anesthesia Postprocedure Evaluation (Signed)
  Anesthesia Post-op Note  Patient: Anthony Weaver  Procedure(s) Performed: Procedure(s): COLONOSCOPY WITH PROPOFOL (At cecum at 1006 total withdrawal time= 16 minutes Ablated Descending Colon Polyp; Ablated four sigmoid polyps (N/A) ESOPHAGOGASTRODUODENOSCOPY (EGD) WITH PROPOFOL (N/A) GASTRIC ULCER BIOPSIES POLYPECTOMY (N/A)  Patient Location: PACU  Anesthesia Type:MAC  Level of Consciousness: awake, alert , oriented and patient cooperative  Airway and Oxygen Therapy: Patient Spontanous Breathing and Patient connected to nasal cannula oxygen  Post-op Pain: none  Post-op Assessment: Post-op Vital signs reviewed, Patient's Cardiovascular Status Stable, Respiratory Function Stable, Patent Airway, No signs of Nausea or vomiting and Pain level controlled  Post-op Vital Signs: Reviewed and stable  Complications: No apparent anesthesia complications

## 2013-05-08 NOTE — Op Note (Signed)
[facility] [address] [place], [address]   COLONOSCOPY PROCEDURE REPORT  PATIENT: Anthony Weaver, Anthony Weaver  MR#:         [id number] BIRTHDATE: 02-04-1976 , 38  yrs. old GENDER: Male ENDOSCOPIST: R.  Garfield Cornea, MD Quentin Ore REFERRED BY:       [facility] [place] PROCEDURE DATE:  05/08/2013 PROCEDURE:      Colonoscopy with snare polypectomy and polyp ablation  INDICATIONS: History of colonic polyps  INFORMED CONSENT:  The risks, benefits, alternatives and imponderables including but not limited to bleeding, perforation as well as the possibility of a missed lesion have been reviewed.  The potential for biopsy, lesion removal, etc. have also been discussed.  Questions have been answered.  All parties agreeable. Please see the history and physical in the medical record for more information.  MEDICATIONS: Deep sedation per Dr[facility]  DESCRIPTION OF PROCEDURE:  After a digital rectal exam was performed, the     colonoscope was advanced from the anus through the rectum and colon to the area of the cecum, ileocecal valve and appendiceal orifice.  The cecum was deeply intubated.  These structures were well-seen and photographed for the record.  From the level of the cecum and ileocecal valve, the scope was slowly and cautiously withdrawn.  The mucosal surfaces were carefully surveyed utilizing scope tip deflection to facilitate fold flattening as needed.  The scope was pulled down into the rectum where a thorough examination including retroflexion was performed.     FINDINGS:  Adequate preparation. Internal hemorrhoids; otherwise, normal rectum. (1) 5 mm serrated appearing polyp in the mid descending segment with adjacent diminutive polyps.  The patient also had (1) 5 mm polyp in the sigmoid with adjacent diminutive polyps; otherwise, the remainder of the colonic mucosa appeared normal. The distal 10 cm of terminal  ileal mucosa also appeared normal.  THERAPEUTIC / DIAGNOSTIC MANEUVERS PERFORMED:    The larger descending colon polyp was hot snare removed; the adjacent polyps were ablated with the hot snare leave. The sigmoid polyps were all ablated with the hot snare loop.  COMPLICATIONS: none  CECAL WITHDRAWAL TIME:  16 minutes  IMPRESSION:  Colonic polyps-treated/removed as described above  RECOMMENDATIONS: Followup on pathology. See EGD report   _______________________________ eSigned:  R. Garfield Cornea, MD FACP [facility] 05/08/2013 10:56 AM   CC:    PATIENT NAME:  Nayel, Purdy MR#: [id number]

## 2013-05-08 NOTE — Anesthesia Preprocedure Evaluation (Addendum)
Anesthesia Evaluation  Patient identified by MRN, date of birth, ID band Patient awake    Reviewed: Allergy & Precautions, H&P , NPO status , Patient's Chart, lab work & pertinent test results  Airway Mallampati: I TM Distance: >3 FB     Dental  (+) Teeth Intact   Pulmonary sleep apnea and Continuous Positive Airway Pressure Ventilation , former smoker,  breath sounds clear to auscultation        Cardiovascular negative cardio ROS  Rhythm:Regular Rate:Normal     Neuro/Psych PSYCHIATRIC DISORDERS Anxiety Depression    GI/Hepatic   Endo/Other  Hypothyroidism   Renal/GU      Musculoskeletal   Abdominal   Peds  Hematology   Anesthesia Other Findings   Reproductive/Obstetrics                          Anesthesia Physical Anesthesia Plan  ASA: II  Anesthesia Plan: MAC   Post-op Pain Management:    Induction: Intravenous  Airway Management Planned: Simple Face Mask  Additional Equipment:   Intra-op Plan:   Post-operative Plan:   Informed Consent: I have reviewed the patients History and Physical, chart, labs and discussed the procedure including the risks, benefits and alternatives for the proposed anesthesia with the patient or authorized representative who has indicated his/her understanding and acceptance.     Plan Discussed with:   Anesthesia Plan Comments:         Anesthesia Quick Evaluation

## 2013-05-08 NOTE — H&P (View-Only) (Signed)
Primary Care Physician:  Netta Cedars, MD  Primary Gastroenterologist:  Garfield Cornea, MD   Chief Complaint  Patient presents with  . Abdominal Pain    HPI:  Anthony Weaver is a 38 y.o. male here for further evaluation of abdominal pain, nausea vomiting. He was seen in the emergency department at [facility] about 4 weeks ago after several day history of N/V and abdominal pain. Seen at [facility] initially and given Zofran for possible gastroenteritis. But after persistent symptoms he went to the ER. CT showed probable gastritis. He was given omeprazole. Started feeling better but if doesn't take it he has recurrent symptoms. Postprandially epigastric pain. Has h/o colon polyps on TCS about 7 years ago. TCS was done for diarrhea. Was supposed to follow-up but didn't do.   No heartburn. Generally tolerates most foods except bananas. Some postprandially diarrhea especially with salad bars. Some regurgitation. No dysphagia. No hematemesis. BM 1 per day. Occasionally skip a day. No melena, brbpr.   Takes occasional Goody's. May take couple of times per month.   Current Outpatient Prescriptions  Medication Sig Dispense Refill  . FLUoxetine (PROZAC) 40 MG capsule Take 40 mg by mouth daily.      Marland Kitchen HYDROcodone-acetaminophen (NORCO) 10-325 MG per tablet Take 1 tablet by mouth daily as needed for moderate pain or severe pain.      Marland Kitchen levothyroxine (SYNTHROID, LEVOTHROID) 75 MCG tablet Take 75 mcg by mouth daily before breakfast.      . meloxicam (MOBIC) 7.5 MG tablet Take 15 mg by mouth daily.      Marland Kitchen OLANZapine (ZYPREXA) 15 MG tablet Take 15 mg by mouth at bedtime.      Marland Kitchen omeprazole (PRILOSEC) 20 MG capsule Take 1 capsule (20 mg total) by mouth daily.  30 capsule  0  . ondansetron (ZOFRAN) 4 MG tablet Take 1 tablet (4 mg total) by mouth every 6 (six) hours.  12 tablet  0  . pravastatin (PRAVACHOL) 40 MG tablet Take 80 mg by mouth at bedtime.      Marland Kitchen tiZANidine (ZANAFLEX) 4 MG capsule Take 4  mg by mouth 3 (three) times daily as needed. For muscle spasms      . zolpidem (AMBIEN) 10 MG tablet Take 10 mg by mouth at bedtime as needed. For sleep       No current facility-administered medications for this visit.    Allergies as of 04/23/2013  . (No Known Allergies)    Past Medical History  Diagnosis Date  . Disc disorder   . Thyroid disease     overactive initially, now underactive  . High cholesterol   . Bipolar disorder   . Depression     Past Surgical History  Procedure Laterality Date  . Appendectomy    . Carpal tunnel release    . Back surgery    . Colonoscopy      Dr. Mady Gemma 7 yrs ago/had polyps  . Breast lumpectomy      left breast/gynecamastia    Family History  Problem Relation Age of Onset  . Crohn's disease Father     ileostomy    History   Social History  . Marital Status: Married    Spouse Name: N/A    Number of Children: N/A  . Years of Education: N/A   Occupational History  . Not on file.   Social History Main Topics  . Smoking status: Never Smoker   . Smokeless tobacco: Not on file  . Alcohol Use:  No  . Drug Use: No  . Sexual Activity: Not on file   Other Topics Concern  . Not on file   Social History Narrative  . No narrative on file      ROS:  General: Negative for anorexia, fever, chills, fatigue, weakness. Denies weight loss. Eyes: Negative for vision changes.  ENT: Negative for hoarseness, difficulty swallowing , nasal congestion. CV: Negative for chest pain, angina, palpitations, dyspnea on exertion, peripheral edema.  Respiratory: Negative for dyspnea at rest, dyspnea on exertion, cough, sputum, wheezing.  GI: See history of present illness. GU:  Negative for dysuria, hematuria, urinary incontinence, urinary frequency, nocturnal urination.  MS: Negative for joint pain. Chronic low back pain.  Derm: Negative for rash or itching.  Neuro: Negative for weakness, abnormal sensation, seizure, frequent headaches,  memory loss, confusion.  Psych: Negative for anxiety, depression, suicidal ideation, hallucinations.  Endo: Negative for unusual weight change.  Heme: Negative for bruising or bleeding. Allergy: Negative for rash or hives.    Physical Examination:  BP 110/72  Pulse 80  Temp(Src) 97.5 F (36.4 C) (Oral)  Wt 242 lb 9.6 oz (110.043 kg)   General: Well-nourished, well-developed in no acute distress. Accompanied by wife. Head: Normocephalic, atraumatic.   Eyes: Conjunctiva pink, no icterus. Mouth: Oropharyngeal mucosa moist and pink , no lesions erythema or exudate. Neck: Supple without thyromegaly, masses, or lymphadenopathy.  Lungs: Clear to auscultation bilaterally.  Heart: Regular rate and rhythm, no murmurs rubs or gallops.  Abdomen: Bowel sounds are normal, nontender, nondistended, no hepatosplenomegaly or masses, no abdominal bruits or    hernia , no rebound or guarding.   Rectal: Not performed Extremities: No lower extremity edema. No clubbing or deformities.  Neuro: Alert and oriented x 4 , grossly normal neurologically.  Skin: Warm and dry, no rash or jaundice.   Psych: Alert and cooperative, normal mood and affect.  Labs: Lab Results  Component Value Date   WBC 8.7 03/14/2013   HGB 15.0 03/14/2013   HCT 44.7 03/14/2013   MCV 90.9 03/14/2013   PLT 214 03/14/2013   Lab Results  Component Value Date   CREATININE 1.22 03/14/2013   BUN 18 03/14/2013   NA 139 03/14/2013   K 3.6 03/14/2013   CL 97 03/14/2013   CO2 30 03/14/2013   Lab Results  Component Value Date   ALT 10 03/14/2013   AST 8 03/14/2013   ALKPHOS 62 03/14/2013   BILITOT 0.5 03/14/2013   Lab Results  Component Value Date   LIPASE 13 03/14/2013     Imaging Studies: CT abdomen and pelvis on 03/14/2013 Impression:  Mild wall thickening of the gastric antrum.

## 2013-05-08 NOTE — Transfer of Care (Signed)
Immediate Anesthesia Transfer of Care Note  Patient: Anthony Weaver  Procedure(s) Performed: Procedure(s): COLONOSCOPY WITH PROPOFOL (At cecum at 1006 total withdrawal time= 16 minutes Ablated Descending Colon Polyp; Ablated four sigmoid polyps (N/A) ESOPHAGOGASTRODUODENOSCOPY (EGD) WITH PROPOFOL (N/A) GASTRIC ULCER BIOPSIES POLYPECTOMY (N/A)  Patient Location: PACU  Anesthesia Type:MAC  Level of Consciousness: awake, alert , oriented and patient cooperative  Airway & Oxygen Therapy: Patient Spontanous Breathing and Patient connected to nasal cannula oxygen  Post-op Assessment: Report given to PACU RN and Post -op Vital signs reviewed and stable  Post vital signs: Reviewed and stable  Complications: No apparent anesthesia complications

## 2013-05-09 ENCOUNTER — Encounter: Payer: Self-pay | Admitting: Internal Medicine

## 2013-05-12 ENCOUNTER — Telehealth: Payer: Self-pay

## 2013-05-12 ENCOUNTER — Encounter (HOSPITAL_COMMUNITY): Payer: Self-pay | Admitting: Internal Medicine

## 2013-05-12 NOTE — Telephone Encounter (Signed)
Letter from: Daneil Dolin  Reason for Letter: Results Review  Send letter to patient.  Send copy of letter with path to referring provider and PCP.  Needs office visit with extender in 3 months for followup EGD.

## 2013-05-12 NOTE — Telephone Encounter (Signed)
Results Cc to PCP  

## 2013-05-12 NOTE — Telephone Encounter (Signed)
Pt is aware of OV on 5/11 at 1130 with LSL

## 2013-05-12 NOTE — Telephone Encounter (Signed)
Mailed letter to pt

## 2013-05-27 ENCOUNTER — Telehealth: Payer: Self-pay | Admitting: Internal Medicine

## 2013-05-27 MED ORDER — ONDANSETRON HCL 4 MG PO TABS: 4.0000 mg | ORAL_TABLET | Freq: Four times a day (QID) | ORAL | Status: DC | PRN

## 2013-05-27 NOTE — Addendum Note (Signed)
Addended by: Mahala Menghini on: 05/27/2013 12:42 PM   Modules accepted: Orders

## 2013-05-27 NOTE — Telephone Encounter (Signed)
Patient was scoped on 05/08/13 by RMR and was put on Protonix daily, he is calling stating that the meds are not working, he is nauseated, cant eat, please advise?

## 2013-05-27 NOTE — Telephone Encounter (Signed)
Continue pantoprazole twice daily. Should be twice daily per RMR note. Please verify he is doing this. No NSAIDS, MOBIC, ASA. RX sent for nausea.  Please send copy of PUD/gastritis diet. Patient should try full liquids. No solid food while having vomiting.

## 2013-05-27 NOTE — Telephone Encounter (Signed)
He is taking in liquids ok but when he eats it does not stay down. He is nausea and vomiting.  He has been taking the Protonix since Feb. 5th. He would like to know if he could get something for nausea called into the [facility] in [place]

## 2013-05-27 NOTE — Telephone Encounter (Signed)
Pt is aware of what LSL said and he is taking the Protonix BID.

## 2013-06-23 ENCOUNTER — Telehealth: Payer: Self-pay | Admitting: Internal Medicine

## 2013-06-23 MED ORDER — ONDANSETRON HCL 4 MG PO TABS: 4.0000 mg | ORAL_TABLET | Freq: Four times a day (QID) | ORAL | Status: DC | PRN

## 2013-06-23 NOTE — Telephone Encounter (Signed)
Patient is very nauseated and vomiting cant eat, very weak scheduled for ov on April 14th patient is needing something done sooner, please advise?

## 2013-06-23 NOTE — Telephone Encounter (Signed)
He thinks the Protonix BID is not work. He states that he has lost about 40 lb in a month. He is very nausea and does not want to eat. He is weak and palm. He is wanting to know if he should go to the ER and get fluids but he really does not know what do to. Please advise

## 2013-06-23 NOTE — Telephone Encounter (Addendum)
Make sure no NSAIDS. Continue Protonix BID.   If he feels dehydrated he should go to ER, urine should be light yellow.   We can call in something for nausea, Zofran sent.  If he does not go to ER, let's try to get him into office this week.  Make sure NO melena or rectal bleeding.

## 2013-06-23 NOTE — Telephone Encounter (Signed)
Pt is coming in to see AS Tuesday at 3:00

## 2013-06-23 NOTE — Addendum Note (Signed)
Addended by: Mahala Menghini on: 06/23/2013 01:08 PM   Modules accepted: Orders

## 2013-06-23 NOTE — Telephone Encounter (Signed)
Opened in error

## 2013-06-23 NOTE — Telephone Encounter (Signed)
Tried to call with no answer  

## 2013-06-24 ENCOUNTER — Encounter: Payer: Self-pay | Admitting: Gastroenterology

## 2013-06-24 ENCOUNTER — Encounter (INDEPENDENT_AMBULATORY_CARE_PROVIDER_SITE_OTHER): Payer: Self-pay

## 2013-06-24 ENCOUNTER — Ambulatory Visit (INDEPENDENT_AMBULATORY_CARE_PROVIDER_SITE_OTHER): Payer: BC Managed Care – PPO | Admitting: Gastroenterology

## 2013-06-24 VITALS — BP 124/92 | HR 85 | Temp 98.9°F | Ht 73.0 in | Wt 197.0 lb

## 2013-06-24 DIAGNOSIS — R112 Nausea with vomiting, unspecified: Secondary | ICD-10-CM

## 2013-06-24 MED ORDER — ESOMEPRAZOLE MAGNESIUM 40 MG PO CPDR: 40.0000 mg | DELAYED_RELEASE_CAPSULE | Freq: Two times a day (BID) | ORAL | Status: DC

## 2013-06-24 MED ORDER — SUCRALFATE 1 GM/10ML PO SUSP: 1.0000 g | Freq: Four times a day (QID) | ORAL | Status: DC

## 2013-06-24 MED ORDER — PROMETHAZINE HCL 25 MG PO TABS: 25.0000 mg | ORAL_TABLET | Freq: Four times a day (QID) | ORAL | Status: DC | PRN

## 2013-06-24 NOTE — Progress Notes (Signed)
Referring Provider: Netta Cedars, MD Primary Care Physician:  Netta Cedars, MD Primary GI: Dr. Gala Romney   Chief Complaint  Patient presents with  . Nausea  . Emesis  . Abdominal Pain    HPI:   38 year old male presenting as urgent work-in secondary to abdominal pain, N/V. EGD in Feb 2015 with prepyloric ulcer, negative H.pylori. Likely secondary to NSAIDs. Colonoscopy with benign polyp. Due for surveillance EGD in May 2015. Gallbladder remains in situ. CT on file from Dec 2014 with gastritis. Lost 36 lbs since Jan 2015. Well-documented.   Took Protonix for about 3 weeks after EGD. Difficulty tolerating eating. Notes abdominal pain below umbilicus, intermittent. Urine dark yellow sometimes during work after sweating a lot. Belching all the time. Throws up stomach acid. No sodas, coffee, tea. Drinks water, gatorade, ensure one a day. Eats pudding, apple sauce, peaches but feels like it gets stuck in epigastric region. Has to make himself swallow the food. After an hour or two, vomiting, dry heaves. Tried Pepto, liquid antacids. Sometimes feels worse after taking Protonix. Tries to take at least one a day. Zofran not helping.   Symptoms mid November with trouble eating. Has previously taken Zyprexa and Prozac but off for about 6 months. Questioning if anxiety has any relation to symptoms.   Past Medical History  Diagnosis Date  . Disc disorder   . Thyroid disease     overactive initially, now underactive  . High cholesterol   . Bipolar disorder   . Depression   . Hypothyroidism   . Sleep apnea   . Arthritis   . Chronic back pain     Past Surgical History  Procedure Laterality Date  . Appendectomy    . Carpal tunnel release Bilateral   . Colonoscopy      Dr. Mady Gemma 7 yrs ago/had polyps  . Breast lumpectomy      left breast/gynecamastia  . Back surgery      laminectomy  . Colonoscopy with propofol N/A 05/08/2013    Dr. Gala Romney: polyps, path with polypoid benign  fragments. Recommend colonoscopy in Feb 2020  . Esophagogastroduodenoscopy (egd) with propofol N/A 05/08/2013    Dr Rourk:Prepyloric gastric ulcer likely NSAID-related likely cause of patient's pain- negative H.pylori  . Esophageal biopsy  05/08/2013    Procedure: GASTRIC ULCER BIOPSIES;  Surgeon: Daneil Dolin, MD;  Location: [facility];  Service: Endoscopy;;  . Polypectomy N/A 05/08/2013    Procedure: POLYPECTOMY;  Surgeon: Daneil Dolin, MD;  Location: [facility];  Service: Endoscopy;  Laterality: N/A;    Current Outpatient Prescriptions  Medication Sig Dispense Refill  . cholecalciferol (VITAMIN D) 1000 UNITS tablet Take 1,000 Units by mouth daily.      Marland Kitchen levothyroxine (SYNTHROID, LEVOTHROID) 75 MCG tablet Take 75 mcg by mouth daily before breakfast.      . pantoprazole (PROTONIX) 40 MG tablet Take 40 mg by mouth 2 (two) times daily.      . vitamin B-12 (CYANOCOBALAMIN) 1000 MCG tablet Take 1,000 mcg by mouth daily.      Marland Kitchen esomeprazole (NEXIUM) 40 MG capsule Take 1 capsule (40 mg total) by mouth 2 (two) times daily before a meal.  60 capsule  3  . promethazine (PHENERGAN) 25 MG tablet Take 1 tablet (25 mg total) by mouth every 6 (six) hours as needed for nausea or vomiting.  30 tablet  0  . sucralfate (CARAFATE) 1 GM/10ML suspension Take 10 mLs (1 g total) by mouth 4 (four) times  daily.  420 mL  1  . testosterone cypionate (DEPOTESTOTERONE CYPIONATE) 100 MG/ML injection Inject 100 mg into the muscle every 14 (fourteen) days. For IM use only       No current facility-administered medications for this visit.    Allergies as of 06/24/2013  . (No Known Allergies)    Family History  Problem Relation Age of Onset  . Crohn's disease Father     ileostomy    History   Social History  . Marital Status: Married    Spouse Name: N/A    Number of Children: N/A  . Years of Education: N/A   Social History Main Topics  . Smoking status: Former Smoker -- 1.00 packs/day for 10 years    Types:  Cigarettes    Quit date: 04/30/1999  . Smokeless tobacco: Not on file     Comment: Quit smoking x 10 years ago  . Alcohol Use: No  . Drug Use: No  . Sexual Activity: Yes    Birth Control/ Protection: None   Other Topics Concern  . Not on file   Social History Narrative  . No narrative on file    Review of Systems: As mentioned in HPI  Physical Exam: BP 124/92  Pulse 85  Temp(Src) 98.9 F (37.2 C) (Oral)  Ht 6\' 1"  (1.854 m)  Wt 197 lb (89.359 kg)  BMI 26.00 kg/m2 General:   Alert and oriented. No distress noted. Pleasant and cooperative.  Head:  Normocephalic and atraumatic. Eyes:  Conjuctiva clear without scleral icterus. Mouth:  Oral mucosa pink and moist. Good dentition. No lesions. Heart:  S1, S2 present without murmurs, rubs, or gallops. Regular rate and rhythm. Abdomen:  +BS, soft, non-tender and non-distended. No rebound or guarding. No HSM or masses noted. Msk:  Symmetrical without gross deformities. Normal posture. Extremities:  Without edema. Neurologic:  Alert and  oriented x4;  grossly normal neurologically. Skin:  Intact without significant lesions or rashes. Cervical Nodes:  No significant cervical adenopathy. Psych:  Alert and cooperative. Normal mood and affect.

## 2013-06-24 NOTE — Patient Instructions (Signed)
Please have blood work done tomorrow, fasting. We have also scheduled you for a CT scan.  I have sent Phenergan for nausea to your pharmacy. Do not drive or operative machinery while taking this. Start taking Carafate four times a day; I have also given you Nexium samples to take the place of Protonix. Take this twice a day.   Further recommendations after blood work and CT is completed.

## 2013-06-25 ENCOUNTER — Ambulatory Visit (HOSPITAL_COMMUNITY)
Admission: RE | Admit: 2013-06-25 | Discharge: 2013-06-25 | Disposition: A | Discharge status: Home / Self Care | Payer: BC Managed Care – PPO | Source: Ambulatory Visit | Attending: Gastroenterology | Admitting: Gastroenterology

## 2013-06-25 ENCOUNTER — Encounter: Payer: Self-pay | Admitting: Gastroenterology

## 2013-06-25 ENCOUNTER — Encounter (HOSPITAL_COMMUNITY): Payer: Self-pay

## 2013-06-25 DIAGNOSIS — R112 Nausea with vomiting, unspecified: Secondary | ICD-10-CM | POA: Insufficient documentation

## 2013-06-25 DIAGNOSIS — Q619 Cystic kidney disease, unspecified: Secondary | ICD-10-CM | POA: Insufficient documentation

## 2013-06-25 DIAGNOSIS — R634 Abnormal weight loss: Secondary | ICD-10-CM | POA: Insufficient documentation

## 2013-06-25 LAB — CBC
HEMATOCRIT: 45.6 % (ref 39.0–52.0)
HEMOGLOBIN: 15.9 g/dL (ref 13.0–17.0)
MCH: 30.3 pg (ref 26.0–34.0)
MCHC: 34.9 g/dL (ref 30.0–36.0)
MCV: 86.9 fL (ref 78.0–100.0)
Platelets: 226 10*3/uL (ref 150–400)
RBC: 5.25 MIL/uL (ref 4.22–5.81)
RDW: 12.7 % (ref 11.5–15.5)
WBC: 5 10*3/uL (ref 4.0–10.5)

## 2013-06-25 MED ORDER — IOHEXOL 300 MG/ML  SOLN: 100.0000 mL | Freq: Once | INTRAMUSCULAR | Status: AC | PRN

## 2013-06-25 NOTE — Assessment & Plan Note (Signed)
38 year old male with known PUD on recent EGD likely NSAID-related, with persistent nausea, vomiting, difficulty tolerating regular diet in the setting of non-compliance with PPI dosing. Notes taking usually once a day but actually feeling worse with Protonix. Unable to tolerate solid food, with vomiting and dry heaves an hour or so after eating. Documented weight loss of close to 40 lbs since Jan 2015. No acute physical findings on exam today. Lower abdominal discomfort, intermittent, no aggravating or relieving factors. Symptoms likely multifactorial due to known PUD, possible anxiety underlying, but I'd like to rule out any underlying etiology, partial gastric outlet obstruction secondary to non-healing ulcer, obtain CBC, HFP, lipase, BMP, TSH, fasting cortisol level. Proceed with CT abd/pelvis tomorrow. Stop Protonix. Trial Nexium BID. Samples provided. Short course of Carafate. Phenergan for nausea prn. EGD for surveillance in May 2015.

## 2013-06-26 LAB — BASIC METABOLIC PANEL
BUN: 10 mg/dL (ref 6–23)
CALCIUM: 9.9 mg/dL (ref 8.4–10.5)
CO2: 28 meq/L (ref 19–32)
CREATININE: 0.83 mg/dL (ref 0.50–1.35)
Chloride: 102 mEq/L (ref 96–112)
Glucose, Bld: 108 mg/dL — ABNORMAL HIGH (ref 70–99)
Potassium: 4 mEq/L (ref 3.5–5.3)
Sodium: 140 mEq/L (ref 135–145)

## 2013-06-26 LAB — TSH: TSH: 1.341 u[IU]/mL (ref 0.350–4.500)

## 2013-06-26 LAB — LIPASE: Lipase: <10 U/L (ref 0–75)

## 2013-06-26 LAB — HEPATIC FUNCTION PANEL
ALBUMIN: 5 g/dL (ref 3.5–5.2)
ALK PHOS: 51 U/L (ref 39–117)
ALT: 9 U/L (ref 0–53)
AST: 8 U/L (ref 0–37)
BILIRUBIN INDIRECT: 0.7 mg/dL (ref 0.2–1.2)
BILIRUBIN TOTAL: 0.8 mg/dL (ref 0.2–1.2)
Bilirubin, Direct: 0.1 mg/dL (ref 0.0–0.3)
Total Protein: 7.1 g/dL (ref 6.0–8.3)

## 2013-06-26 LAB — CORTISOL-AM, BLOOD: Cortisol - AM: 16.2 ug/dL (ref 4.3–22.4)

## 2013-06-26 NOTE — Progress Notes (Signed)
Quick Note:  CT benign. Good news.  Lipase, CBC, BMP, TSH normal. Awaiting cortisol level. How is patient doing? It is imperative he takes a PPI BID  ______

## 2013-06-30 NOTE — Progress Notes (Signed)
cc'd to pcp 

## 2013-07-03 NOTE — Progress Notes (Signed)
Quick Note:  Cortisol level normal.  How is patient doing. ______

## 2013-07-07 ENCOUNTER — Telehealth: Payer: Self-pay | Admitting: *Deleted

## 2013-07-07 NOTE — Telephone Encounter (Signed)
Pt is returning a call to [place]. Please Advise [phone].

## 2013-07-07 NOTE — Progress Notes (Signed)
Quick Note:  LMOM for a return call. ______ 

## 2013-07-08 ENCOUNTER — Encounter: Payer: Self-pay | Admitting: Gastroenterology

## 2013-07-08 NOTE — Progress Notes (Signed)
Quick Note:  Pt returned call and was informed of CT and lab results.  Anthony Weaver he is doing better on the Nexium bid, he has actually put on a couple of pounds since he can eat better.  He said that Laban Emperor, NP was going to give him a work note, because he had been out of work due to loss of weight and couldn't eat.  He needs to return on Mon. Please let him know when the note is ready and he will come by to pick it up. ______

## 2013-07-08 NOTE — Telephone Encounter (Signed)
Pt aware, see under result note.

## 2013-07-08 NOTE — Telephone Encounter (Signed)
LMOM to call.

## 2013-07-08 NOTE — Progress Notes (Signed)
Quick Note:  LMOM again this AM for a return call. ______

## 2013-07-08 NOTE — Progress Notes (Signed)
Quick Note:  Pt's Nexium has been denied per Almyra Free. ( Pt had asked could he take the OTC Nexium? ______

## 2013-07-08 NOTE — Progress Notes (Signed)
Quick Note:  Why was this denied? He needs BID PPI dosing due to known ulcer. What was the reason? Do they need an alternative PPI? What is covered? Yes, he may use if he runs out, but the best would be prescription strength. He would have to take 2 capsules to equal just 1 dose ______

## 2013-07-09 ENCOUNTER — Telehealth: Payer: Self-pay | Admitting: Gastroenterology

## 2013-07-09 NOTE — Telephone Encounter (Signed)
Insurance denied BID PPI dosing, which is unfortunate.   Have him take Nexium 40 mg each morning (from his prescription), and supplement with either OTC Nexium 40 mg or samples from [place] for a total of 3 months.

## 2013-07-11 ENCOUNTER — Telehealth: Payer: Self-pay | Admitting: Internal Medicine

## 2013-07-11 NOTE — Telephone Encounter (Signed)
Advice Only     [place] FORMS                 Call Documentation     Theadora Rama at 07/11/2013 8:22 AM     Status: Signed        Patient has forms for [employer] to be filled out. They were in the [facility] bin, but [place] doesn't do these. AS was the last extender to see patient so I have put the forms in her office to fill out.

## 2013-07-11 NOTE — Telephone Encounter (Signed)
Patient has forms for [employer] to be filled out. They were in the [facility] bin, but [place] doesn't do these. AS was the last extender to see patient so I have put the forms in her office to fill out.

## 2013-07-11 NOTE — Telephone Encounter (Signed)
Routing to AS 

## 2013-07-14 ENCOUNTER — Encounter: Payer: Self-pay | Admitting: Gastroenterology

## 2013-07-14 DIAGNOSIS — K279 Peptic ulcer, site unspecified, unspecified as acute or chronic, without hemorrhage or perforation: Secondary | ICD-10-CM | POA: Insufficient documentation

## 2013-07-14 NOTE — Telephone Encounter (Signed)
Completed.

## 2013-07-14 NOTE — Telephone Encounter (Signed)
Called- LMOM- asked him to come by and pick up samples. Left #12 bottles of nexium at the front desk.

## 2013-07-15 ENCOUNTER — Ambulatory Visit: Payer: BC Managed Care – PPO | Admitting: Gastroenterology

## 2013-07-17 NOTE — Telephone Encounter (Signed)
Forms were faxed x 2 and placed in CS bin to be sent to scanning center.

## 2013-08-11 ENCOUNTER — Ambulatory Visit: Payer: BC Managed Care – PPO | Admitting: Gastroenterology

## 2013-08-13 ENCOUNTER — Ambulatory Visit (INDEPENDENT_AMBULATORY_CARE_PROVIDER_SITE_OTHER): Payer: BC Managed Care – PPO | Admitting: Gastroenterology

## 2013-08-13 ENCOUNTER — Encounter (INDEPENDENT_AMBULATORY_CARE_PROVIDER_SITE_OTHER): Payer: Self-pay

## 2013-08-13 ENCOUNTER — Encounter: Payer: Self-pay | Admitting: Gastroenterology

## 2013-08-13 VITALS — BP 127/82 | HR 83 | Temp 97.4°F | Ht 73.0 in | Wt 192.4 lb

## 2013-08-13 DIAGNOSIS — K279 Peptic ulcer, site unspecified, unspecified as acute or chronic, without hemorrhage or perforation: Secondary | ICD-10-CM

## 2013-08-13 DIAGNOSIS — Z86010 Personal history of colonic polyps: Secondary | ICD-10-CM

## 2013-08-13 MED ORDER — ESOMEPRAZOLE MAGNESIUM 40 MG PO CPDR: 40.0000 mg | DELAYED_RELEASE_CAPSULE | Freq: Once | ORAL | Status: DC

## 2013-08-13 MED ORDER — ESOMEPRAZOLE MAGNESIUM 40 MG PO CPDR: 40.0000 mg | DELAYED_RELEASE_CAPSULE | Freq: Every day | ORAL | Status: DC

## 2013-08-13 NOTE — Patient Instructions (Signed)
Continue Nexium twice a day for now.   We have set up a repeat upper endoscopy with Dr. Gala Romney for further evaluation.

## 2013-08-13 NOTE — Assessment & Plan Note (Signed)
Next surveillance 2020

## 2013-08-13 NOTE — Progress Notes (Signed)
Referring Provider: Netta Cedars, MD Primary Care Physician:  Netta Cedars, MD Primary GI: Dr. Gala Romney   Chief Complaint  Patient presents with  . Follow-up    HPI:   Anthony Weaver presents today in follow-up with a history of prepyloric ulcer in Feb 2015, negative H.pylori. Likely secondary to NSAIDs. Colonoscopy with benign polyp at that time. Due for surveillance EGD now. Was seen in March with acute abdominal pain, N/V, difficulty tolerating oral medications. Stat CT at that time benign. Weight remains stable overall.   Drinks plenty of fluids. Works at [employer]. Taking Nexium BID. No abdominal pain. No N/V. No GI complaints today.   Past Medical History  Diagnosis Date  . Disc disorder   . Thyroid disease     overactive initially, now underactive  . High cholesterol   . Bipolar disorder   . Depression   . Hypothyroidism   . Sleep apnea   . Arthritis   . Chronic back pain     Past Surgical History  Procedure Laterality Date  . Appendectomy    . Carpal tunnel release Bilateral   . Colonoscopy      Dr. Mady Gemma 7 yrs ago/had polyps  . Breast lumpectomy      left breast/gynecamastia  . Back surgery      laminectomy  . Colonoscopy with propofol N/A 05/08/2013    Dr. Gala Romney: polyps, path with polypoid benign fragments. Recommend colonoscopy in Feb 2020  . Esophagogastroduodenoscopy (egd) with propofol N/A 05/08/2013    Dr Rourk:Prepyloric gastric ulcer likely NSAID-related likely cause of patient's pain- negative H.pylori  . Esophageal biopsy  05/08/2013    Procedure: GASTRIC ULCER BIOPSIES;  Surgeon: Daneil Dolin, MD;  Location: [facility];  Service: Endoscopy;;  . Polypectomy N/A 05/08/2013    Procedure: POLYPECTOMY;  Surgeon: Daneil Dolin, MD;  Location: [facility];  Service: Endoscopy;  Laterality: N/A;    Current Outpatient Prescriptions  Medication Sig Dispense Refill  . cholecalciferol (VITAMIN D) 1000 UNITS tablet Take 1,000 Units by mouth daily.      Marland Kitchen  esomeprazole (NEXIUM) 40 MG capsule Take 1 capsule (40 mg total) by mouth 2 (two) times daily before a meal.  60 capsule  3  . HYDROcodone-acetaminophen (NORCO) 10-325 MG per tablet Take 1 tablet by mouth every 4 (four) hours as needed.      Marland Kitchen levothyroxine (SYNTHROID, LEVOTHROID) 75 MCG tablet Take 75 mcg by mouth daily before breakfast.      . lisinopril (PRINIVIL,ZESTRIL) 10 MG tablet Take 10 mg by mouth daily.      . sucralfate (CARAFATE) 1 GM/10ML suspension Take 10 mLs (1 g total) by mouth 4 (four) times daily.  420 mL  1  . testosterone cypionate (DEPOTESTOTERONE CYPIONATE) 100 MG/ML injection Inject 100 mg into the muscle every 14 (fourteen) days. For IM use only      . vitamin B-12 (CYANOCOBALAMIN) 1000 MCG tablet Take 1,000 mcg by mouth daily.       No current facility-administered medications for this visit.    Allergies as of 08/13/2013  . (No Known Allergies)    Family History  Problem Relation Age of Onset  . Crohn's disease Father     ileostomy    History   Social History  . Marital Status: Married    Spouse Name: N/A    Number of Children: N/A  . Years of Education: N/A   Social History Main Topics  . Smoking status: Former Smoker --  1.00 packs/day for 10 years    Types: Cigarettes    Quit date: 04/30/1999  . Smokeless tobacco: None     Comment: Quit smoking x 10 years ago  . Alcohol Use: No  . Drug Use: No  . Sexual Activity: Yes    Birth Control/ Protection: None   Other Topics Concern  . None   Social History Narrative  . None    Review of Systems: As mentioned in HPI  Physical Exam: BP 127/82  Pulse 83  Temp(Src) 97.4 F (36.3 C) (Oral)  Ht 6\' 1"  (1.854 m)  Wt 192 lb 6.4 oz (87.272 kg)  BMI 25.39 kg/m2 General:   Alert and oriented. No distress noted. Pleasant and cooperative.  Head:  Normocephalic and atraumatic. Eyes:  Conjuctiva clear without scleral icterus. Mouth:  Oral mucosa pink and moist. Good dentition. No lesions. Neck:   Supple, without mass or thyromegaly. Heart:  S1, S2 present without murmurs, rubs, or gallops. Regular rate and rhythm. Abdomen:  +BS, soft, non-tender and non-distended. No rebound or guarding. No HSM or masses noted. Msk:  Symmetrical without gross deformities. Normal posture. Extremities:  Without edema. Neurologic:  Alert and  oriented x4;  grossly normal neurologically. Skin:  Intact without significant lesions or rashes. Cervical Nodes:  No significant cervical adenopathy. Psych:  Alert and cooperative. Normal mood and affect.

## 2013-08-13 NOTE — Assessment & Plan Note (Signed)
38 year old male with preplyoric ulcer in Feb 2015 likely secondary to NSAIDs, negative H.pylori. Due for surveillance now. Continues on Nexium BID, with no concerning upper or lower GI symptoms.  Although last procedure done with Propofol, he would like to try conscious sedation. Appears in the remote past he had difficulty with sedation years ago; he takes Norco twice a day, does not drink ETOH or use illicit drugs. No other significant medications. I feel he will tolerate this well, with the addition of Phenergan 25 mg IV on call. Discussed possibility of failed sedation and need for repeat procedure later, and patient understands this. He still desires to "try" standard sedation.  Proceed with upper endoscopy in the near future with Dr. Gala Romney. The risks, benefits, and alternatives have been discussed in detail with patient. They have stated understanding and desire to proceed.  Phenergan 25 mg IV on call

## 2013-08-14 ENCOUNTER — Ambulatory Visit (HOSPITAL_COMMUNITY)
Admission: RE | Admit: 2013-08-14 | Discharge: 2013-08-14 | Disposition: A | Discharge status: Home / Self Care | Payer: BC Managed Care – PPO | Source: Ambulatory Visit | Attending: Internal Medicine | Admitting: Internal Medicine

## 2013-08-14 ENCOUNTER — Encounter (HOSPITAL_COMMUNITY)
Admission: RE | Disposition: A | Discharge status: Home / Self Care | Payer: Self-pay | Source: Ambulatory Visit | Attending: Internal Medicine

## 2013-08-14 ENCOUNTER — Encounter (HOSPITAL_COMMUNITY): Payer: Self-pay | Admitting: *Deleted

## 2013-08-14 DIAGNOSIS — Z79899 Other long term (current) drug therapy: Secondary | ICD-10-CM | POA: Insufficient documentation

## 2013-08-14 DIAGNOSIS — G8929 Other chronic pain: Secondary | ICD-10-CM | POA: Insufficient documentation

## 2013-08-14 DIAGNOSIS — G4730 Sleep apnea, unspecified: Secondary | ICD-10-CM | POA: Insufficient documentation

## 2013-08-14 DIAGNOSIS — M549 Dorsalgia, unspecified: Secondary | ICD-10-CM | POA: Insufficient documentation

## 2013-08-14 DIAGNOSIS — F3289 Other specified depressive episodes: Secondary | ICD-10-CM | POA: Insufficient documentation

## 2013-08-14 DIAGNOSIS — F329 Major depressive disorder, single episode, unspecified: Secondary | ICD-10-CM | POA: Insufficient documentation

## 2013-08-14 DIAGNOSIS — Z8719 Personal history of other diseases of the digestive system: Secondary | ICD-10-CM

## 2013-08-14 DIAGNOSIS — E039 Hypothyroidism, unspecified: Secondary | ICD-10-CM | POA: Insufficient documentation

## 2013-08-14 DIAGNOSIS — Z09 Encounter for follow-up examination after completed treatment for conditions other than malignant neoplasm: Secondary | ICD-10-CM | POA: Insufficient documentation

## 2013-08-14 DIAGNOSIS — K279 Peptic ulcer, site unspecified, unspecified as acute or chronic, without hemorrhage or perforation: Secondary | ICD-10-CM

## 2013-08-14 DIAGNOSIS — Z87891 Personal history of nicotine dependence: Secondary | ICD-10-CM | POA: Insufficient documentation

## 2013-08-14 DIAGNOSIS — Z8711 Personal history of peptic ulcer disease: Secondary | ICD-10-CM | POA: Insufficient documentation

## 2013-08-14 HISTORY — PX: ESOPHAGOGASTRODUODENOSCOPY: SHX5428

## 2013-08-14 SURGERY — EGD (ESOPHAGOGASTRODUODENOSCOPY)
Anesthesia: Moderate Sedation

## 2013-08-14 MED ORDER — MIDAZOLAM HCL 5 MG/5ML IJ SOLN
INTRAMUSCULAR | Status: AC
  Filled 2013-08-14: qty 10

## 2013-08-14 MED ORDER — PROMETHAZINE HCL 25 MG/ML IJ SOLN
25.0000 mg | Freq: Once | INTRAMUSCULAR | Status: AC
  Administered 2013-08-14: 25 mg via INTRAVENOUS
  Filled 2013-08-14: qty 1

## 2013-08-14 MED ORDER — STERILE WATER FOR IRRIGATION IR SOLN
Status: DC | PRN
  Administered 2013-08-14: 14:00:00

## 2013-08-14 MED ORDER — LIDOCAINE VISCOUS 2 % MT SOLN
OROMUCOSAL | Status: AC
  Filled 2013-08-14: qty 15

## 2013-08-14 MED ORDER — ONDANSETRON HCL 4 MG/2ML IJ SOLN
INTRAMUSCULAR | Status: AC
  Filled 2013-08-14: qty 2

## 2013-08-14 MED ORDER — SODIUM CHLORIDE 0.9 % IV SOLN
INTRAVENOUS | Status: DC
  Administered 2013-08-14: 1000 mL via INTRAVENOUS

## 2013-08-14 MED ORDER — LIDOCAINE VISCOUS 2 % MT SOLN
OROMUCOSAL | Status: DC | PRN
  Administered 2013-08-14: 3 mL via OROMUCOSAL

## 2013-08-14 MED ORDER — MEPERIDINE HCL 100 MG/ML IJ SOLN
INTRAMUSCULAR | Status: AC
  Filled 2013-08-14: qty 2

## 2013-08-14 MED ORDER — MIDAZOLAM HCL 5 MG/5ML IJ SOLN
INTRAMUSCULAR | Status: DC | PRN
  Administered 2013-08-14 (×4): 2 mg via INTRAVENOUS

## 2013-08-14 MED ORDER — ONDANSETRON HCL 4 MG/2ML IJ SOLN
INTRAMUSCULAR | Status: DC | PRN
  Administered 2013-08-14: 4 mg via INTRAVENOUS

## 2013-08-14 MED ORDER — MEPERIDINE HCL 100 MG/ML IJ SOLN
INTRAMUSCULAR | Status: DC | PRN
  Administered 2013-08-14 (×4): 50 mg via INTRAVENOUS

## 2013-08-14 MED ORDER — SODIUM CHLORIDE 0.9 % IJ SOLN
INTRAMUSCULAR | Status: AC
  Filled 2013-08-14: qty 10

## 2013-08-14 NOTE — H&P (View-Only) (Signed)
Referring Provider: Netta Cedars, MD Primary Care Physician:  Netta Cedars, MD Primary GI: Dr. Gala Romney   Chief Complaint  Patient presents with  . Follow-up    HPI:   Anthony Weaver presents today in follow-up with a history of prepyloric ulcer in Feb 2015, negative H.pylori. Likely secondary to NSAIDs. Colonoscopy with benign polyp at that time. Due for surveillance EGD now. Was seen in March with acute abdominal pain, N/V, difficulty tolerating oral medications. Stat CT at that time benign. Weight remains stable overall.   Drinks plenty of fluids. Works at [employer]. Taking Nexium BID. No abdominal pain. No N/V. No GI complaints today.   Past Medical History  Diagnosis Date  . Disc disorder   . Thyroid disease     overactive initially, now underactive  . High cholesterol   . Bipolar disorder   . Depression   . Hypothyroidism   . Sleep apnea   . Arthritis   . Chronic back pain     Past Surgical History  Procedure Laterality Date  . Appendectomy    . Carpal tunnel release Bilateral   . Colonoscopy      Dr. Mady Gemma 7 yrs ago/had polyps  . Breast lumpectomy      left breast/gynecamastia  . Back surgery      laminectomy  . Colonoscopy with propofol N/A 05/08/2013    Dr. Gala Romney: polyps, path with polypoid benign fragments. Recommend colonoscopy in Feb 2020  . Esophagogastroduodenoscopy (egd) with propofol N/A 05/08/2013    Dr Rourk:Prepyloric gastric ulcer likely NSAID-related likely cause of patient's pain- negative H.pylori  . Esophageal biopsy  05/08/2013    Procedure: GASTRIC ULCER BIOPSIES;  Surgeon: Daneil Dolin, MD;  Location: [facility];  Service: Endoscopy;;  . Polypectomy N/A 05/08/2013    Procedure: POLYPECTOMY;  Surgeon: Daneil Dolin, MD;  Location: [facility];  Service: Endoscopy;  Laterality: N/A;    Current Outpatient Prescriptions  Medication Sig Dispense Refill  . cholecalciferol (VITAMIN D) 1000 UNITS tablet Take 1,000 Units by mouth daily.      Marland Kitchen  esomeprazole (NEXIUM) 40 MG capsule Take 1 capsule (40 mg total) by mouth 2 (two) times daily before a meal.  60 capsule  3  . HYDROcodone-acetaminophen (NORCO) 10-325 MG per tablet Take 1 tablet by mouth every 4 (four) hours as needed.      Marland Kitchen levothyroxine (SYNTHROID, LEVOTHROID) 75 MCG tablet Take 75 mcg by mouth daily before breakfast.      . lisinopril (PRINIVIL,ZESTRIL) 10 MG tablet Take 10 mg by mouth daily.      . sucralfate (CARAFATE) 1 GM/10ML suspension Take 10 mLs (1 g total) by mouth 4 (four) times daily.  420 mL  1  . testosterone cypionate (DEPOTESTOTERONE CYPIONATE) 100 MG/ML injection Inject 100 mg into the muscle every 14 (fourteen) days. For IM use only      . vitamin B-12 (CYANOCOBALAMIN) 1000 MCG tablet Take 1,000 mcg by mouth daily.       No current facility-administered medications for this visit.    Allergies as of 08/13/2013  . (No Known Allergies)    Family History  Problem Relation Age of Onset  . Crohn's disease Father     ileostomy    History   Social History  . Marital Status: Married    Spouse Name: N/A    Number of Children: N/A  . Years of Education: N/A   Social History Main Topics  . Smoking status: Former Smoker --  1.00 packs/day for 10 years    Types: Cigarettes    Quit date: 04/30/1999  . Smokeless tobacco: None     Comment: Quit smoking x 10 years ago  . Alcohol Use: No  . Drug Use: No  . Sexual Activity: Yes    Birth Control/ Protection: None   Other Topics Concern  . None   Social History Narrative  . None    Review of Systems: As mentioned in HPI  Physical Exam: BP 127/82  Pulse 83  Temp(Src) 97.4 F (36.3 C) (Oral)  Ht 6\' 1"  (1.854 m)  Wt 192 lb 6.4 oz (87.272 kg)  BMI 25.39 kg/m2 General:   Alert and oriented. No distress noted. Pleasant and cooperative.  Head:  Normocephalic and atraumatic. Eyes:  Conjuctiva clear without scleral icterus. Mouth:  Oral mucosa pink and moist. Good dentition. No lesions. Neck:   Supple, without mass or thyromegaly. Heart:  S1, S2 present without murmurs, rubs, or gallops. Regular rate and rhythm. Abdomen:  +BS, soft, non-tender and non-distended. No rebound or guarding. No HSM or masses noted. Msk:  Symmetrical without gross deformities. Normal posture. Extremities:  Without edema. Neurologic:  Alert and  oriented x4;  grossly normal neurologically. Skin:  Intact without significant lesions or rashes. Cervical Nodes:  No significant cervical adenopathy. Psych:  Alert and cooperative. Normal mood and affect.

## 2013-08-14 NOTE — Interval H&P Note (Signed)
History and Physical Interval Note:  08/14/2013 2:20 PM  Anthony Weaver  has presented today for surgery, with the diagnosis of GASTRIC ULCER  The various methods of treatment have been discussed with the patient and family. After consideration of risks, benefits and other options for treatment, the patient has consented to  Procedure(s) with comments: ESOPHAGOGASTRODUODENOSCOPY (EGD) (N/A) - 1:45PM as a surgical intervention .  The patient's history has been reviewed, patient examined, no change in status, stable for surgery.  I have reviewed the patient's chart and labs.  Questions were answered to the patient's satisfaction.      No change. EGD per plan.The risks, benefits, limitations, alternatives and imponderables have been reviewed with the patient. Potential for esophageal dilation, biopsy, etc. have also been reviewed.  Questions have been answered. All parties agreeable.   Cristopher Estimable Auset Fritzler

## 2013-08-14 NOTE — Op Note (Signed)
[place], [id number]   ENDOSCOPY PROCEDURE REPORT  PATIENT: Anthony Weaver, Anthony Weaver  MR#: #[id number] BIRTHDATE: 1975/05/15 , 38  yrs. old GENDER: Male ENDOSCOPIST: R.  Garfield Cornea, MD [facility] REFERRED BY:      Dr. Netta Cedars PROCEDURE DATE:  08/14/2013 PROCEDURE:     Surveillance EGD  INDICATIONS:    history of gastric ulcer  INFORMED CONSENT:   The risks, benefits, limitations, alternatives and imponderables have been discussed.  The potential for biopsy, esophogeal dilation, etc. have also been reviewed.  Questions have been answered.  All parties agreeable.  Please see the history and physical in the medical record for more information.  MEDICATIONS:  Versed 8 mg IV and Demerol 200 mg IV in divided doses. Zofran 4 mg IV. Phenergan 25 mg IV. Xylocaine gel orally.  DESCRIPTION OF PROCEDURE:   The EG-[id number] ([id number])  endoscope was introduced through the mouth and advanced to the second portion of the duodenum without difficulty or limitations.  The mucosal surfaces were surveyed very carefully during advancement of the scope and upon withdrawal.  Retroflexion view of the proximal stomach and esophagogastric junction was performed.      FINDINGS: Normal esophagus. Stomach empty. Deformity of the Antral/Prepyloric Mucosa. Previously Noted Peptic Ulcer Disease has Completely Healed. Patent Pylorus. Normal First and Second Portion of the Duodenum.  THERAPEUTIC / DIAGNOSTIC MANEUVERS PERFORMED:  none   COMPLICATIONS:  None  IMPRESSION:   Previously noted peptic ulcer disease-completely healed  RECOMMENDATIONS:   Would continue to refrain from taking all forms of nonsteroidal agents. May stop twice a day Nexium.    _______________________________ R. Garfield Cornea, MD FACP [facility] eSigned:  R. Garfield Cornea, MD FACP [facility] 08/14/2013 3:00 PM     CC:

## 2013-08-14 NOTE — Discharge Instructions (Signed)
EGD Discharge instructions Please read the instructions outlined below and refer to this sheet in the next few weeks. These discharge instructions provide you with general information on caring for yourself after you leave the hospital. Your doctor may also give you specific instructions. While your treatment has been planned according to the most current medical practices available, unavoidable complications occasionally occur. If you have any problems or questions after discharge, please call your doctor. ACTIVITY  You may resume your regular activity but move at a slower pace for the next 24 hours.   Take frequent rest periods for the next 24 hours.   Walking will help expel (get rid of) the air and reduce the bloated feeling in your abdomen.   No driving for 24 hours (because of the anesthesia (medicine) used during the test).   You may shower.   Do not sign any important legal documents or operate any machinery for 24 hours (because of the anesthesia used during the test).  NUTRITION  Drink plenty of fluids.   You may resume your normal diet.   Begin with a light meal and progress to your normal diet.   Avoid alcoholic beverages for 24 hours or as instructed by your caregiver.  MEDICATIONS  You may resume your normal medications unless your caregiver tells you otherwise.  WHAT YOU CAN EXPECT TODAY  You may experience abdominal discomfort such as a feeling of fullness or gas pains.  FOLLOW-UP  Your doctor will discuss the results of your test with you.  SEEK IMMEDIATE MEDICAL ATTENTION IF ANY OF THE FOLLOWING OCCUR:  Excessive nausea (feeling sick to your stomach) and/or vomiting.   Severe abdominal pain and distention (swelling).   Trouble swallowing.   Temperature over 101 F (37.8 C).   Rectal bleeding or vomiting of blood.    May stop Nexium.    Would absolutely continue to avoid all nonsteroidal drugs  Please call with any future problems or  questions.

## 2013-08-18 ENCOUNTER — Encounter (HOSPITAL_COMMUNITY): Payer: Self-pay | Admitting: Internal Medicine

## 2013-10-24 ENCOUNTER — Other Ambulatory Visit: Payer: Self-pay | Admitting: Podiatry

## 2013-10-24 ENCOUNTER — Encounter (HOSPITAL_COMMUNITY): Payer: Self-pay | Admitting: Pharmacy Technician

## 2013-10-26 NOTE — Addendum Note (Signed)
Addended by: Caprice Beaver on: 10/26/2013 09:51 AM   Modules accepted: Orders

## 2013-10-27 ENCOUNTER — Other Ambulatory Visit: Payer: Self-pay

## 2013-10-27 ENCOUNTER — Encounter (HOSPITAL_COMMUNITY)
Admission: RE | Admit: 2013-10-27 | Discharge: 2013-10-27 | Disposition: A | Discharge status: Home / Self Care | Payer: BC Managed Care – PPO | Source: Ambulatory Visit | Attending: Podiatry | Admitting: Podiatry

## 2013-10-27 ENCOUNTER — Encounter (HOSPITAL_COMMUNITY): Payer: Self-pay

## 2013-10-27 HISTORY — DX: Fibromyalgia: M79.7

## 2013-10-27 HISTORY — DX: Essential (primary) hypertension: I10

## 2013-10-27 LAB — BASIC METABOLIC PANEL
Anion gap: 12 (ref 5–15)
BUN: 19 mg/dL (ref 6–23)
CALCIUM: 10.2 mg/dL (ref 8.4–10.5)
CO2: 32 meq/L (ref 19–32)
CREATININE: 1.14 mg/dL (ref 0.50–1.35)
Chloride: 96 mEq/L (ref 96–112)
GFR calc Af Amer: >90 mL/min (ref >90)
GFR calc non Af Amer: 79 mL/min — ABNORMAL LOW (ref >90)
Glucose, Bld: 91 mg/dL (ref 70–99)
Potassium: 4.3 mEq/L (ref 3.7–5.3)
Sodium: 140 mEq/L (ref 137–147)

## 2013-10-27 LAB — HEMOGLOBIN AND HEMATOCRIT, BLOOD
HEMATOCRIT: 45.4 % (ref 39.0–52.0)
Hemoglobin: 15.7 g/dL (ref 13.0–17.0)

## 2013-10-27 NOTE — Patient Instructions (Signed)
Anthony Weaver  10/27/2013   Your procedure is scheduled on:   10/30/2013  Report to [facility] at  42  AM.  Call this number if you have problems the morning of surgery: [phone]   Remember:   Do not eat food or drink liquids after midnight.   Take these medicines the morning of surgery with A SIP OF WATER:  Hydrocodone, clonazepam, lisinopril, zanaflex   Do not wear jewelry, make-up or nail polish.  Do not wear lotions, powders, or perfumes.   Do not shave 48 hours prior to surgery. Men may shave face and neck.  Do not bring valuables to the hospital.  [facility] is not responsible for any belongings or valuables.               Contacts, dentures or bridgework may not be worn into surgery.  Leave suitcase in the car. After surgery it may be brought to your room.  For patients admitted to the hospital, discharge time is determined by your treatment team.               Patients discharged the day of surgery will not be allowed to drive home.  Name and phone number of your driver: family  Special Instructions: Shower using CHG 2 nights before surgery and the night before surgery.  If you shower the day of surgery use CHG.  Use special wash - you have one bottle of CHG for all showers.  You should use approximately 1/3 of the bottle for each shower.   Please read over the following fact sheets that you were given: Pain Booklet, Coughing and Deep Breathing, Surgical Site Infection Prevention, Anesthesia Post-op Instructions and Care and Recovery After Surgery Plantar Fasciitis Plantar fasciitis is a common condition that causes foot pain. It is soreness (inflammation) of the band of tough fibrous tissue on the bottom of the foot that runs from the heel bone (calcaneus) to the ball of the foot. The cause of this soreness may be from excessive standing, poor fitting shoes, running on hard surfaces, being overweight, having an abnormal walk, or overuse (this is common in runners)  of the painful foot or feet. It is also common in aerobic exercise dancers and ballet dancers. SYMPTOMS  Most people with plantar fasciitis complain of:  Severe pain in the morning on the bottom of their foot especially when taking the first steps out of bed. This pain recedes after a few minutes of walking.  Severe pain is experienced also during walking following a long period of inactivity.  Pain is worse when walking barefoot or up stairs DIAGNOSIS   Your caregiver will diagnose this condition by examining and feeling your foot.  Special tests such as X-rays of your foot, are usually not needed. PREVENTION   Consult a sports medicine professional before beginning a new exercise program.  Walking programs offer a good workout. With walking there is a lower chance of overuse injuries common to runners. There is less impact and less jarring of the joints.  Begin all new exercise programs slowly. If problems or pain develop, decrease the amount of time or distance until you are at a comfortable level.  Wear good shoes and replace them regularly.  Stretch your foot and the heel cords at the back of the ankle (Achilles tendon) both before and after exercise.  Run or exercise on even surfaces that are not hard. For example, asphalt is better than pavement.  Do not run barefoot on hard surfaces.  If using a treadmill, vary the incline.  Do not continue to workout if you have foot or joint problems. Seek professional help if they do not improve. HOME CARE INSTRUCTIONS   Avoid activities that cause you pain until you recover.  Use ice or cold packs on the problem or painful areas after working out.  Only take over-the-counter or prescription medicines for pain, discomfort, or fever as directed by your caregiver.  Soft shoe inserts or athletic shoes with air or gel sole cushions may be helpful.  If problems continue or become more severe, consult a sports medicine caregiver or  your own health care provider. Cortisone is a potent anti-inflammatory medication that may be injected into the painful area. You can discuss this treatment with your caregiver. MAKE SURE YOU:   Understand these instructions.  Will watch your condition.  Will get help right away if you are not doing well or get worse. Document Released: 12/13/2000 Document Revised: 06/12/2011 Document Reviewed: 02/12/2008 [facility] Patient Information 2015 [place], [place]. This information is not intended to replace advice given to you by your health care provider. Make sure you discuss any questions you have with your health care provider. Monitored Anesthesia Care Monitored anesthesia care is an anesthesia service for a medical procedure. Anesthesia is the loss of the ability to feel pain. It is produced by medicines called anesthetics. It may affect a small area of your body (local anesthesia), a large area of your body (regional anesthesia), or your entire body (general anesthesia). The need for monitored anesthesia care depends your procedure, your condition, and the potential need for regional or general anesthesia. It is often provided during procedures where:   General anesthesia may be needed if there are complications. This is because you need special care when you are under general anesthesia.   You will be under local or regional anesthesia. This is so that you are able to have higher levels of anesthesia if needed.   You will receive calming medicines (sedatives). This is especially the case if sedatives are given to put you in a semi-conscious state of relaxation (deep sedation). This is because the amount of sedative needed to produce this state can be hard to predict. Too much of a sedative can produce general anesthesia. Monitored anesthesia care is performed by one or more health care providers who have special training in all types of anesthesia. You will need to meet with these health care  providers before your procedure. During this meeting, they will ask you about your medical history. They will also give you instructions to follow. (For example, you will need to stop eating and drinking before your procedure. You may also need to stop or change medicines you are taking.) During your procedure, your health care providers will stay with you. They will:   Watch your condition. This includes watching your blood pressure, breathing, and level of pain.   Diagnose and treat problems that occur.   Give medicines if they are needed. These may include calming medicines (sedatives) and anesthetics.   Make sure you are comfortable.  Having monitored anesthesia care does not necessarily mean that you will be under anesthesia. It does mean that your health care providers will be able to manage anesthesia if you need it or if it occurs. It also means that you will be able to have a different type of anesthesia than you are having if you need it. When your procedure  is complete, your health care providers will continue to watch your condition. They will make sure any medicines wear off before you are allowed to go home.  Document Released: 12/14/2004 Document Revised: 08/04/2013 Document Reviewed: 05/01/2012 [facility] Patient Information 2015 [place], [place]. This information is not intended to replace advice given to you by your health care provider. Make sure you discuss any questions you have with your health care provider.

## 2013-10-27 NOTE — Pre-Procedure Instructions (Signed)
Patient given information to sign up for my chart at home. 

## 2013-10-30 ENCOUNTER — Encounter (HOSPITAL_COMMUNITY): Payer: Self-pay | Admitting: *Deleted

## 2013-10-30 ENCOUNTER — Encounter (HOSPITAL_COMMUNITY): Payer: BC Managed Care – PPO | Admitting: Anesthesiology

## 2013-10-30 ENCOUNTER — Encounter (HOSPITAL_COMMUNITY)
Admission: RE | Disposition: A | Discharge status: Home / Self Care | Payer: Self-pay | Source: Ambulatory Visit | Attending: Podiatry

## 2013-10-30 ENCOUNTER — Ambulatory Visit (HOSPITAL_COMMUNITY)
Admission: RE | Admit: 2013-10-30 | Discharge: 2013-10-30 | Disposition: A | Discharge status: Home / Self Care | Payer: BC Managed Care – PPO | Source: Ambulatory Visit | Attending: Podiatry | Admitting: Podiatry

## 2013-10-30 ENCOUNTER — Ambulatory Visit (HOSPITAL_COMMUNITY): Payer: BC Managed Care – PPO | Admitting: Anesthesiology

## 2013-10-30 DIAGNOSIS — Z87891 Personal history of nicotine dependence: Secondary | ICD-10-CM | POA: Insufficient documentation

## 2013-10-30 DIAGNOSIS — M722 Plantar fascial fibromatosis: Secondary | ICD-10-CM | POA: Insufficient documentation

## 2013-10-30 DIAGNOSIS — Z79899 Other long term (current) drug therapy: Secondary | ICD-10-CM | POA: Insufficient documentation

## 2013-10-30 DIAGNOSIS — I10 Essential (primary) hypertension: Secondary | ICD-10-CM | POA: Insufficient documentation

## 2013-10-30 DIAGNOSIS — F341 Dysthymic disorder: Secondary | ICD-10-CM | POA: Insufficient documentation

## 2013-10-30 DIAGNOSIS — E039 Hypothyroidism, unspecified: Secondary | ICD-10-CM | POA: Insufficient documentation

## 2013-10-30 HISTORY — PX: PLANTAR FASCIA RELEASE: SHX2239

## 2013-10-30 SURGERY — RELEASE, FASCIA, PLANTAR
Anesthesia: Monitor Anesthesia Care | Site: Foot | Laterality: Right

## 2013-10-30 MED ORDER — CEFAZOLIN SODIUM-DEXTROSE 2-3 GM-% IV SOLR: 2.0000 g | INTRAVENOUS | Status: DC

## 2013-10-30 MED ORDER — MIDAZOLAM HCL 2 MG/2ML IJ SOLN
INTRAMUSCULAR | Status: AC
  Filled 2013-10-30: qty 2

## 2013-10-30 MED ORDER — PROPOFOL INFUSION 10 MG/ML OPTIME
INTRAVENOUS | Status: DC | PRN
  Administered 2013-10-30: 90 ug/kg/min via INTRAVENOUS
  Administered 2013-10-30: 09:00:00 via INTRAVENOUS

## 2013-10-30 MED ORDER — LIDOCAINE HCL (PF) 1 % IJ SOLN
INTRAMUSCULAR | Status: AC
Start: 2013-10-30 — End: 2013-10-30
  Filled 2013-10-30: qty 5

## 2013-10-30 MED ORDER — FENTANYL CITRATE 0.05 MG/ML IJ SOLN
INTRAMUSCULAR | Status: AC
  Filled 2013-10-30: qty 5

## 2013-10-30 MED ORDER — MIDAZOLAM HCL 2 MG/2ML IJ SOLN
1.0000 mg | INTRAMUSCULAR | Status: DC | PRN
Start: 2013-10-30 — End: 2013-10-30
  Administered 2013-10-30: 2 mg via INTRAVENOUS

## 2013-10-30 MED ORDER — FENTANYL CITRATE 0.05 MG/ML IJ SOLN
25.0000 ug | INTRAMUSCULAR | Status: AC
  Administered 2013-10-30 (×2): 25 ug via INTRAVENOUS

## 2013-10-30 MED ORDER — ONDANSETRON HCL 4 MG/2ML IJ SOLN: 4.0000 mg | Freq: Once | INTRAMUSCULAR | Status: DC | PRN

## 2013-10-30 MED ORDER — CEFAZOLIN SODIUM-DEXTROSE 2-3 GM-% IV SOLR
INTRAVENOUS | Status: AC
  Filled 2013-10-30: qty 50

## 2013-10-30 MED ORDER — PROPOFOL 10 MG/ML IV EMUL
INTRAVENOUS | Status: AC
  Filled 2013-10-30: qty 20

## 2013-10-30 MED ORDER — ONDANSETRON HCL 4 MG/2ML IJ SOLN: 4.0000 mg | Freq: Once | INTRAMUSCULAR | Status: DC

## 2013-10-30 MED ORDER — FENTANYL CITRATE 0.05 MG/ML IJ SOLN
INTRAMUSCULAR | Status: AC
  Filled 2013-10-30: qty 2

## 2013-10-30 MED ORDER — BUPIVACAINE HCL (PF) 0.5 % IJ SOLN
INTRAMUSCULAR | Status: AC
  Filled 2013-10-30: qty 30

## 2013-10-30 MED ORDER — SODIUM CHLORIDE 0.9 % IR SOLN
Status: DC | PRN
  Administered 2013-10-30: 1000 mL

## 2013-10-30 MED ORDER — FENTANYL CITRATE 0.05 MG/ML IJ SOLN: 25.0000 ug | INTRAMUSCULAR | Status: DC | PRN

## 2013-10-30 MED ORDER — MIDAZOLAM HCL 5 MG/5ML IJ SOLN
INTRAMUSCULAR | Status: DC | PRN
  Administered 2013-10-30 (×2): 2 mg via INTRAVENOUS

## 2013-10-30 MED ORDER — LACTATED RINGERS IV SOLN
INTRAVENOUS | Status: DC
  Administered 2013-10-30: 09:00:00 via INTRAVENOUS

## 2013-10-30 MED ORDER — BUPIVACAINE HCL (PF) 0.5 % IJ SOLN
INTRAMUSCULAR | Status: DC | PRN
  Administered 2013-10-30: 20 mL

## 2013-10-30 MED ORDER — ROCURONIUM BROMIDE 50 MG/5ML IV SOLN
INTRAVENOUS | Status: AC
  Filled 2013-10-30: qty 1

## 2013-10-30 MED ORDER — FENTANYL CITRATE 0.05 MG/ML IJ SOLN
INTRAMUSCULAR | Status: DC | PRN
  Administered 2013-10-30 (×2): 25 ug via INTRAVENOUS
  Administered 2013-10-30: 50 ug via INTRAVENOUS

## 2013-10-30 SURGICAL SUPPLY — 39 items
BAG HAMPER (MISCELLANEOUS) ×3 IMPLANT
BANDAGE CONFORM 2  STR LF (GAUZE/BANDAGES/DRESSINGS) ×3 IMPLANT
BANDAGE ELASTIC 4 VELCRO NS (GAUZE/BANDAGES/DRESSINGS) ×3 IMPLANT
BANDAGE ESMARK 4X12 BL STRL LF (DISPOSABLE) ×1 IMPLANT
BENZOIN TINCTURE PRP APPL 2/3 (GAUZE/BANDAGES/DRESSINGS) ×3 IMPLANT
BNDG CONFORM 3 STRL LF (GAUZE/BANDAGES/DRESSINGS) ×3 IMPLANT
BNDG ESMARK 4X12 BLUE STRL LF (DISPOSABLE) ×3
BNDG GAUZE ELAST 4 BULKY (GAUZE/BANDAGES/DRESSINGS) ×3 IMPLANT
CHLORAPREP W/TINT 26ML (MISCELLANEOUS) ×3 IMPLANT
CLOSURE WOUND 1/2 X4 (GAUZE/BANDAGES/DRESSINGS) ×1
CLOTH BEACON ORANGE TIMEOUT ST (SAFETY) ×3 IMPLANT
COVER LIGHT HANDLE STERIS (MISCELLANEOUS) ×6 IMPLANT
CUFF TOURNIQUET SINGLE 18IN (TOURNIQUET CUFF) ×3 IMPLANT
DRSG ADAPTIC 3X8 NADH LF (GAUZE/BANDAGES/DRESSINGS) ×3 IMPLANT
DRSG XEROFORM 1X8 (GAUZE/BANDAGES/DRESSINGS) ×3 IMPLANT
GAUZE SPONGE 4X4 12PLY STRL (GAUZE/BANDAGES/DRESSINGS) ×3 IMPLANT
GLOVE BIO SURGEON STRL SZ 6.5 (GLOVE) ×2 IMPLANT
GLOVE BIO SURGEON STRL SZ7.5 (GLOVE) ×3 IMPLANT
GLOVE BIO SURGEONS STRL SZ 6.5 (GLOVE) ×1
GLOVE BIOGEL PI IND STRL 7.0 (GLOVE) ×2 IMPLANT
GLOVE BIOGEL PI IND STRL 7.5 (GLOVE) ×1 IMPLANT
GLOVE BIOGEL PI INDICATOR 7.0 (GLOVE) ×4
GLOVE BIOGEL PI INDICATOR 7.5 (GLOVE) ×2
GLOVE SS BIOGEL STRL SZ 6.5 (GLOVE) ×2 IMPLANT
GLOVE SUPERSENSE BIOGEL SZ 6.5 (GLOVE) ×4
GOWN STRL REUS W/TWL LRG LVL3 (GOWN DISPOSABLE) ×9 IMPLANT
KIT ROOM TURNOVER APOR (KITS) ×3 IMPLANT
MANIFOLD NEPTUNE II (INSTRUMENTS) ×3 IMPLANT
NEEDLE HYPO 27GX1-1/4 (NEEDLE) ×6 IMPLANT
NS IRRIG 1000ML POUR BTL (IV SOLUTION) ×3 IMPLANT
PACK BASIC LIMB (CUSTOM PROCEDURE TRAY) ×3 IMPLANT
PAD ARMBOARD 7.5X6 YLW CONV (MISCELLANEOUS) ×3 IMPLANT
SET BASIN LINEN APH (SET/KITS/TRAYS/PACK) ×3 IMPLANT
STRIP CLOSURE SKIN 1/2X4 (GAUZE/BANDAGES/DRESSINGS) ×2 IMPLANT
SUT ETHILON 3 0 FSL (SUTURE) ×3 IMPLANT
SUT MON AB 5-0 PS2 18 (SUTURE) IMPLANT
SUT VIC AB 4-0 PS2 27 (SUTURE) ×3 IMPLANT
SYRINGE CONTROL L 12CC (SYRINGE) ×6 IMPLANT
TOWEL OR 17X26 4PK STRL BLUE (TOWEL DISPOSABLE) ×3 IMPLANT

## 2013-10-30 NOTE — Transfer of Care (Signed)
Immediate Anesthesia Transfer of Care Note  Patient: Anthony Weaver  Procedure(s) Performed: Procedure(s): OPEN PLANTAR FASCIA RELEASE (Right)  Patient Location: PACU  Anesthesia Type:MAC  Level of Consciousness: awake, alert , oriented and patient cooperative  Airway & Oxygen Therapy: Patient Spontanous Breathing and Patient connected to face mask oxygen  Post-op Assessment: Report given to PACU RN, Post -op Vital signs reviewed and stable and Patient moving all extremities  Post vital signs: Reviewed and stable  Complications: No apparent anesthesia complications

## 2013-10-30 NOTE — Anesthesia Preprocedure Evaluation (Signed)
Anesthesia Evaluation  Patient identified by MRN, date of birth, ID band Patient awake    Reviewed: Allergy & Precautions, H&P , NPO status , Patient's Chart, lab work & pertinent test results  Airway Mallampati: I TM Distance: >3 FB     Dental  (+) Teeth Intact   Pulmonary sleep apnea and Continuous Positive Airway Pressure Ventilation , former smoker,  breath sounds clear to auscultation        Cardiovascular hypertension, Pt. on medications negative cardio ROS  Rhythm:Regular Rate:Normal     Neuro/Psych PSYCHIATRIC DISORDERS Anxiety Depression    GI/Hepatic   Endo/Other  Hypothyroidism   Renal/GU      Musculoskeletal  (+) Fibromyalgia -  Abdominal   Peds  Hematology   Anesthesia Other Findings   Reproductive/Obstetrics                           Anesthesia Physical Anesthesia Plan  ASA: II  Anesthesia Plan: MAC   Post-op Pain Management:    Induction: Intravenous  Airway Management Planned: Nasal Cannula  Additional Equipment:   Intra-op Plan:   Post-operative Plan:   Informed Consent: I have reviewed the patients History and Physical, chart, labs and discussed the procedure including the risks, benefits and alternatives for the proposed anesthesia with the patient or authorized representative who has indicated his/her understanding and acceptance.     Plan Discussed with:   Anesthesia Plan Comments:         Anesthesia Quick Evaluation

## 2013-10-30 NOTE — Op Note (Signed)
OPERATIVE NOTE  DATE OF PROCEDURE:  10/30/2013  SURGEON:   Marcheta Grammes, DPM  OR STAFF:   Circulator: Avon Gully, RN Scrub Person: Evelene Croon, CST Circulator Assistant: Randalyn Rhea, RN RN First Assistant: Towanda Malkin, RN   PREOPERATIVE DIAGNOSIS:   1.  Plantar fascitis, right foot (ICD-9 728.71) 2.  Pain, right foot (ICD-9 729.5)  POSTOPERATIVE DIAGNOSIS: Same  PROCEDURE: Open plantar fasciotomy, right foot (CPT [phone])  ANESTHESIA:  Monitor Anesthesia Care   HEMOSTASIS:   Pneumatic ankle tourniquet set at 250 mmHg  ESTIMATED BLOOD LOSS:   Minimal (<5 cc)  MATERIALS USED:  None  INJECTABLES: Marcaine 0.5% plain; 29mL  PATHOLOGY:   None  COMPLICATIONS:   None  INDICATIONS:  Heel pain nonresponsive to nonsurgical care.  DESCRIPTION OF THE PROCEDURE:   The patient was brought to the operating room and placed on the operative table in the supine position.  A pneumatic ankle tourniquet was applied to the patient's ankle.  Following sedation, the surgical site was anesthetized with 0.5% Marcaine plain.  The foot was then prepped, scrubbed, and draped in the usual sterile technique.  The foot was elevated, exsanguinated and the pneumatic ankle tourniquet inflated to 250 mmHg.  Attention was directed to the plantar aspect of the right foot.  A transverse incision was made distal to the plantar heel pad.  Dissection was continued deep down to the level of the plantar fascia.  The plantar fascia was found to have increased thickness.  The medial one half of the plantar fascia was transected using a #15 blade.  The surgical wound was irrigated with copious amounts of sterile irrigant.  The subcutaneous structures were reapproximated using 4-0 Vicryl.  The skin was reapproximated using 4-0 Prolene.  The incision closure was reinforced with Steri-Strips.  A sterile compressive dressing was applied to the right foot.  The pneumatic ankle tourniquet was  deflated and a prompt hyperemic response was noted to all digits of the right foot.   The patient tolerated the procedure well.  The patient was then transferred to PACU with vital signs stable and vascular status intact to all toes of the operative foot.  Following a period of postoperative monitoring, the patient will be discharged home.

## 2013-10-30 NOTE — H&P (Signed)
HISTORY AND PHYSICAL INTERVAL NOTE:  10/30/2013  8:18 AM  Anthony Weaver  has presented today for surgery, with the diagnosis of plantar fascitis and pain right .  The various methods of treatment have been discussed with the patient.  No guarantees were given.  After consideration of risks, benefits and other options for treatment, the patient has consented to surgery.  I have reviewed the patients' chart and labs.    Patient Vitals for the past 24 hrs:  Temp Temp src Pulse Resp  10/30/13 0753 98.6 F (37 C) Oral 97 24    A history and physical examination was performed in my office.  The patient was reexamined.  There have been no changes to this history and physical examination.  Marcheta Grammes, DPM

## 2013-10-30 NOTE — Anesthesia Postprocedure Evaluation (Signed)
  Anesthesia Post-op Note  Patient: Anthony Weaver  Procedure(s) Performed: Procedure(s): OPEN PLANTAR FASCIA RELEASE (Right)  Patient Location: PACU  Anesthesia Type:MAC  Level of Consciousness: awake, alert , oriented and patient cooperative  Airway and Oxygen Therapy: Patient Spontanous Breathing  Post-op Pain: 3 /10, mild  Post-op Assessment: Post-op Vital signs reviewed, Patient's Cardiovascular Status Stable, Respiratory Function Stable, Patent Airway, No signs of Nausea or vomiting and Pain level controlled  Post-op Vital Signs: Reviewed and stable  Last Vitals:  Filed Vitals:   10/30/13 0845  BP: 119/83  Pulse:   Temp:   Resp: 15    Complications: No apparent anesthesia complications

## 2013-10-31 ENCOUNTER — Encounter (HOSPITAL_COMMUNITY): Payer: Self-pay | Admitting: Podiatry

## 2013-11-06 ENCOUNTER — Emergency Department (HOSPITAL_COMMUNITY)
Admission: EM | Admit: 2013-11-06 | Discharge: 2013-11-06 | Disposition: A | Discharge status: Home / Self Care | Payer: Self-pay | Source: Home / Self Care

## 2014-02-18 ENCOUNTER — Other Ambulatory Visit (HOSPITAL_COMMUNITY): Payer: Self-pay | Admitting: Orthopaedic Surgery

## 2014-02-18 DIAGNOSIS — M25561 Pain in right knee: Secondary | ICD-10-CM

## 2014-02-25 ENCOUNTER — Ambulatory Visit (HOSPITAL_COMMUNITY): Payer: BC Managed Care – PPO

## 2014-04-06 ENCOUNTER — Ambulatory Visit (HOSPITAL_COMMUNITY)
Admission: RE | Admit: 2014-04-06 | Discharge: 2014-04-06 | Disposition: A | Discharge status: Home / Self Care | Payer: BLUE CROSS/BLUE SHIELD | Source: Ambulatory Visit | Attending: Orthopaedic Surgery | Admitting: Orthopaedic Surgery

## 2014-04-06 DIAGNOSIS — M25561 Pain in right knee: Secondary | ICD-10-CM | POA: Diagnosis present

## 2014-04-06 DIAGNOSIS — M94261 Chondromalacia, right knee: Secondary | ICD-10-CM | POA: Insufficient documentation

## 2014-04-06 DIAGNOSIS — M179 Osteoarthritis of knee, unspecified: Secondary | ICD-10-CM | POA: Diagnosis not present

## 2014-05-06 ENCOUNTER — Ambulatory Visit: Payer: Self-pay

## 2014-08-01 ENCOUNTER — Emergency Department (HOSPITAL_COMMUNITY)
Admission: EM | Admit: 2014-08-01 | Discharge: 2014-08-01 | Disposition: A | Discharge status: Home / Self Care | Payer: BLUE CROSS/BLUE SHIELD | Attending: Emergency Medicine | Admitting: Emergency Medicine

## 2014-08-01 ENCOUNTER — Encounter (HOSPITAL_COMMUNITY): Payer: Self-pay | Admitting: Emergency Medicine

## 2014-08-01 ENCOUNTER — Emergency Department (HOSPITAL_COMMUNITY): Payer: BLUE CROSS/BLUE SHIELD

## 2014-08-01 DIAGNOSIS — G8929 Other chronic pain: Secondary | ICD-10-CM | POA: Insufficient documentation

## 2014-08-01 DIAGNOSIS — M542 Cervicalgia: Secondary | ICD-10-CM | POA: Diagnosis present

## 2014-08-01 DIAGNOSIS — Z87891 Personal history of nicotine dependence: Secondary | ICD-10-CM | POA: Diagnosis not present

## 2014-08-01 DIAGNOSIS — E039 Hypothyroidism, unspecified: Secondary | ICD-10-CM | POA: Diagnosis not present

## 2014-08-01 DIAGNOSIS — M5412 Radiculopathy, cervical region: Secondary | ICD-10-CM | POA: Insufficient documentation

## 2014-08-01 DIAGNOSIS — Z79899 Other long term (current) drug therapy: Secondary | ICD-10-CM | POA: Insufficient documentation

## 2014-08-01 DIAGNOSIS — F329 Major depressive disorder, single episode, unspecified: Secondary | ICD-10-CM | POA: Diagnosis not present

## 2014-08-01 MED ORDER — OXYCODONE-ACETAMINOPHEN 5-325 MG PO TABS
1.0000 | ORAL_TABLET | Freq: Once | ORAL | Status: AC
  Administered 2014-08-01: 1 via ORAL
  Filled 2014-08-01: qty 1

## 2014-08-01 MED ORDER — OXYCODONE-ACETAMINOPHEN 5-325 MG PO TABS: 1.0000 | ORAL_TABLET | ORAL | Status: AC | PRN

## 2014-08-01 NOTE — Discharge Instructions (Signed)
Cervical Radiculopathy Cervical radiculopathy happens when a nerve in the neck is pinched or bruised by a slipped (herniated) disk or by arthritic changes in the bones of the cervical spine. This can occur due to an injury or as part of the normal aging process. Pressure on the cervical nerves can cause pain or numbness that runs from your neck all the way down into your arm and fingers. CAUSES  There are many possible causes, including:  Injury.  Muscle tightness in the neck from overuse.  Swollen, painful joints (arthritis).  Breakdown or degeneration in the bones and joints of the spine (spondylosis) due to aging.  Bone spurs that may develop near the cervical nerves. SYMPTOMS  Symptoms include pain, weakness, or numbness in the affected arm and hand. Pain can be severe or irritating. Symptoms may be worse when extending or turning the neck. DIAGNOSIS  Your caregiver will ask about your symptoms and do a physical exam. He or she may test your strength and reflexes. X-rays, CT scans, and MRI scans may be needed in cases of injury or if the symptoms do not go away after a period of time. Electromyography (EMG) or nerve conduction testing may be done to study how your nerves and muscles are working. TREATMENT  Your caregiver may recommend certain exercises to help relieve your symptoms. Cervical radiculopathy can, and often does, get better with time and treatment. If your problems continue, treatment options may include:  Wearing a soft collar for short periods of time.  Physical therapy to strengthen the neck muscles.  Medicines, such as nonsteroidal anti-inflammatory drugs (NSAIDs), oral corticosteroids, or spinal injections.  Surgery. Different types of surgery may be done depending on the cause of your problems. HOME CARE INSTRUCTIONS   Put ice on the affected area.  Put ice in a plastic bag.  Place a towel between your skin and the bag.  Leave the ice on for 15-20 minutes,  03-04 times a day or as directed by your caregiver.  If ice does not help, you can try using heat. Take a warm shower or bath, or use a hot water bottle as directed by your caregiver.  You may try a gentle neck and shoulder massage.  Use a flat pillow when you sleep.  Only take over-the-counter or prescription medicines for pain, discomfort, or fever as directed by your caregiver.  If physical therapy was prescribed, follow your caregiver's directions.  If a soft collar was prescribed, use it as directed. SEEK IMMEDIATE MEDICAL CARE IF:   Your pain gets much worse and cannot be controlled with medicines.  You have weakness or numbness in your hand, arm, face, or leg.  You have a high fever or a stiff, rigid neck.  You lose bowel or bladder control (incontinence).  You have trouble with walking, balance, or speaking. MAKE SURE YOU:   Understand these instructions.  Will watch your condition.  Will get help right away if you are not doing well or get worse. Document Released: 12/13/2000 Document Revised: 06/12/2011 Document Reviewed: 11/01/2010 [facility] Patient Information 2015 [place], [place]. This information is not intended to replace advice given to you by your health care provider. Make sure you discuss any questions you have with your health care provider.  You are being scheduled for an outpatient MRI and should be contacted by our radiology department for this.  In the interim, use the medicine prescribed for pain.  Do not drive within 4 hours of taking as it will make  you drowsy.  Start taking your zanaflex as prescribed.  You will need to see a neurosurgeon if your MRI indicates that you have a surgical problem. Your plain xray today does not give [place] the answers we need, as discussed.

## 2014-08-01 NOTE — ED Notes (Signed)
Patient c/o neck pain that radiates into right arm with numbness in right hand. Per patient started after lifting something over his head at work.  Per patient seen by PCP and given tramadol, told to follow-up with neurologist for MRI but appointment keeps getting moved back. Denies any relief with medication. Per patient can't sleep or eat. Reports nausea.

## 2014-08-02 NOTE — ED Provider Notes (Signed)
CSN: [id number]     Arrival date & time 08/01/14  1811 History   First MD Initiated Contact with Patient 08/01/14 1824     Chief Complaint  Patient presents with  . Neck Pain     (Consider location/radiation/quality/duration/timing/severity/associated sxs/prior Treatment) The history is provided by the patient.   Anthony Weaver is a 39 y.o. male presenting with persistent neck pain now with radiation of intermittent tingling into his right 3rd, 4th and 5th fingers since he sustained a lifting injury at work over a month ago.  He was seen by an orthopedist in [place] and sent for physical therapy but was stopped as it was not helping. Initially, it was felt to be a right shoulder injury, but has now been decided it is a cervical injury and is awaiting referral to a neurologist.  However, he is having worsening pain with the radiation and is unable to sleep and experiences nausea when the symptoms are severe.   He denies weakness in the arm, tingling is triggered by rightward neck rotation and attempting to lift the arm over shoulder height level.  He has taken hydrocodone and zanaflex without symptom relief.     Past Medical History  Diagnosis Date  . Disc disorder   . Thyroid disease     overactive initially, now underactive  . High cholesterol   . Bipolar disorder   . Depression   . Hypothyroidism   . Arthritis   . Chronic back pain   . Hypertension   . Sleep apnea   . Fibromyalgia    Past Surgical History  Procedure Laterality Date  . Appendectomy    . Carpal tunnel release Bilateral   . Colonoscopy      Dr. Mady Gemma 7 yrs ago/had polyps  . Breast lumpectomy      left breast/gynecamastia  . Colonoscopy with propofol N/A 05/08/2013    Dr. Gala Romney: polyps, path with polypoid benign fragments. Recommend colonoscopy in Feb 2020  . Esophagogastroduodenoscopy (egd) with propofol N/A 05/08/2013    Dr Rourk:Prepyloric gastric ulcer likely NSAID-related likely cause of patient's  pain- negative H.pylori  . Esophageal biopsy  05/08/2013    Procedure: GASTRIC ULCER BIOPSIES;  Surgeon: Daneil Dolin, MD;  Location: [facility] ORS;  Service: Endoscopy;;  . Polypectomy N/A 05/08/2013    Procedure: POLYPECTOMY;  Surgeon: Daneil Dolin, MD;  Location: [facility];  Service: Endoscopy;  Laterality: N/A;  . Esophagogastroduodenoscopy N/A 08/14/2013    Procedure: ESOPHAGOGASTRODUODENOSCOPY (EGD);  Surgeon: Daneil Dolin, MD;  Location: [facility] ENDO SUITE;  Service: Endoscopy;  Laterality: N/A;  1:45PM  . Back surgery      laminectomy  . Plantar fascia release Right 10/30/2013    Procedure: OPEN PLANTAR FASCIA RELEASE;  Surgeon: Marcheta Grammes, DPM;  Location: [facility] ORS;  Service: Podiatry;  Laterality: Right;   Family History  Problem Relation Age of Onset  . Crohn's disease Father     ileostomy   History  Substance Use Topics  . Smoking status: Former Smoker -- 0.00 packs/day for 10 years    Quit date: 04/30/1999  . Smokeless tobacco: Current User    Types: Chew     Comment: Quit smoking x 10 years ago  . Alcohol Use: 16.8 oz/week    28 Cans of beer per week    Review of Systems  Constitutional: Negative for fever.  Musculoskeletal: Positive for arthralgias and neck pain. Negative for myalgias and joint swelling.  Neurological: Positive for numbness. Negative for  weakness.      Allergies  Nsaids  Home Medications   Prior to Admission medications   Medication Sig Start Date End Date Taking? Authorizing Provider  clonazePAM (KLONOPIN) 0.5 MG tablet Take 0.5 mg by mouth 3 (three) times daily as needed for anxiety.    Yes Historical Provider, MD  HYDROcodone-acetaminophen (NORCO) 7.5-325 MG per tablet Take 1 tablet by mouth every 6 (six) hours as needed for moderate pain.   Yes Historical Provider, MD  levothyroxine (SYNTHROID, LEVOTHROID) 75 MCG tablet Take 75 mcg by mouth daily before breakfast.   Yes Historical Provider, MD  tiZANidine (ZANAFLEX) 4 MG tablet Take 4 mg by  mouth daily as needed for muscle spasms.    Yes Historical Provider, MD  oxyCODONE-acetaminophen (PERCOCET/ROXICET) 5-325 MG per tablet Take 1 tablet by mouth every 4 (four) hours as needed. 08/01/14   Evalee Jefferson, PA-C   BP 137/95 mmHg  Pulse 96  Temp(Src) 99 F (37.2 C) (Oral)  Resp 16  Ht 6\' 1"  (1.854 m)  Wt 181 lb (82.101 kg)  BMI 23.89 kg/m2  SpO2 100% Physical Exam  Constitutional: He appears well-developed and well-nourished.  HENT:  Head: Atraumatic.  Neck: Normal range of motion.  Cardiovascular:  Pulses equal bilaterally  Musculoskeletal: He exhibits tenderness.       Cervical back: He exhibits bony tenderness. He exhibits no spasm.       Back:  ttp mid cervical and right paracervical neck.  Neurological: He is alert. He has normal strength. He displays normal reflexes. No sensory deficit.  Reflex Scores:      Bicep reflexes are 2+ on the right side and 2+ on the left side.      Brachioradialis reflexes are 1+ on the right side and 1+ on the left side. Bicep and tricep, wrist  Flex/ext 5/5 strength.  Grip strength 4/5 right, 5/5 left.  Skin: Skin is warm and dry.  Psychiatric: He has a normal mood and affect.    ED Course  Procedures (including critical care time) Labs Review Labs Reviewed - No data to display  Imaging Review Dg Cervical Spine Complete  08/01/2014   CLINICAL DATA:  Neck pain right shoulder pain for 1 month. Lifting injury at work.  EXAM: CERVICAL SPINE  4+ VIEWS  COMPARISON:  None.  FINDINGS: There is no evidence of cervical spine fracture or prevertebral soft tissue swelling. Alignment is normal. No other significant bone abnormalities are identified.  IMPRESSION: Negative cervical spine radiographs.   Electronically Signed   By: Nolon Nations M.D.   On: 08/01/2014 20:12     EKG Interpretation None      MDM   Final diagnoses:  Cervical radiculopathy    Patients labs and/or radiological studies were reviewed and considered during the  medical decision making and disposition process.  Results were also discussed with patient. Concern for possible disk herniation given radiculopathy and subtle right weakness.  He was scheduled for an outpatient MRI,  If positive for surgical issue, would consider referral to Dr. Carloyn Manner or neurosurgeon of choice (as he lives in [place]).  He was prescribed oxycodone for pain. Encouraged heat tx.  Unable to tolerate nsaids due to PUD.      Evalee Jefferson, PA-C 08/02/14 [place], MD 08/03/14 [phone]

## 2014-08-03 ENCOUNTER — Other Ambulatory Visit (HOSPITAL_COMMUNITY): Payer: Self-pay | Admitting: Emergency Medicine

## 2014-08-03 DIAGNOSIS — M5412 Radiculopathy, cervical region: Secondary | ICD-10-CM

## 2014-08-06 ENCOUNTER — Ambulatory Visit (HOSPITAL_COMMUNITY): Admission: RE | Admit: 2014-08-06 | Payer: BLUE CROSS/BLUE SHIELD | Source: Ambulatory Visit

## 2015-07-22 ENCOUNTER — Other Ambulatory Visit: Payer: Self-pay | Admitting: Anesthesiology

## 2015-10-01 ENCOUNTER — Encounter (HOSPITAL_COMMUNITY): Admission: RE | Payer: Self-pay | Source: Ambulatory Visit

## 2015-10-01 ENCOUNTER — Ambulatory Visit (HOSPITAL_COMMUNITY): Admission: RE | Admit: 2015-10-01 | Payer: BLUE CROSS/BLUE SHIELD | Source: Ambulatory Visit | Admitting: Anesthesiology

## 2015-10-01 SURGERY — INSERTION, SPINAL CORD STIMULATOR, LUMBAR
Anesthesia: Monitor Anesthesia Care

## 2018-03-06 ENCOUNTER — Telehealth: Payer: Self-pay

## 2018-03-06 NOTE — Telephone Encounter (Signed)
Received a call from [facility] saying they think it is time for the pt to have next colonoscopy. I told them he is on recall for 05/2018 and will be sent a reminder if he is not having any problems. She said that is fine.

## 2018-04-22 ENCOUNTER — Encounter: Payer: Self-pay | Admitting: Internal Medicine

## 2022-12-22 ENCOUNTER — Other Ambulatory Visit: Payer: Self-pay | Admitting: Neurosurgery

## 2022-12-22 DIAGNOSIS — M961 Postlaminectomy syndrome, not elsewhere classified: Secondary | ICD-10-CM

## 2023-01-25 ENCOUNTER — Encounter: Payer: Self-pay | Admitting: Neurosurgery

## 2023-02-01 NOTE — Discharge Instructions (Signed)

## 2023-02-02 ENCOUNTER — Inpatient Hospital Stay
Admission: RE | Admit: 2023-02-02 | Discharge: 2023-02-02 | Disposition: A | Discharge status: Home / Self Care | Payer: BLUE CROSS/BLUE SHIELD | Source: Ambulatory Visit | Attending: Neurosurgery | Admitting: Neurosurgery

## 2023-02-02 ENCOUNTER — Other Ambulatory Visit: Payer: BC Managed Care – PPO

## 2023-02-07 NOTE — Discharge Instructions (Signed)

## 2023-02-08 ENCOUNTER — Ambulatory Visit
Admission: RE | Admit: 2023-02-08 | Discharge: 2023-02-08 | Disposition: A | Discharge status: Home / Self Care | Payer: BC Managed Care – PPO | Source: Ambulatory Visit | Attending: Neurosurgery | Admitting: Neurosurgery

## 2023-02-08 DIAGNOSIS — M961 Postlaminectomy syndrome, not elsewhere classified: Secondary | ICD-10-CM

## 2023-02-08 MED ORDER — MEPERIDINE HCL 50 MG/ML IJ SOLN
50.0000 mg | Freq: Once | INTRAMUSCULAR | Status: DC | PRN
Start: 2023-02-08 — End: 2023-02-09

## 2023-02-08 MED ORDER — ONDANSETRON HCL 4 MG/2ML IJ SOLN: 4.0000 mg | Freq: Once | INTRAMUSCULAR | Status: DC | PRN

## 2023-02-08 MED ORDER — DIAZEPAM 5 MG PO TABS: 10.0000 mg | ORAL_TABLET | Freq: Once | ORAL | Status: DC

## 2023-02-08 MED ORDER — IOPAMIDOL (ISOVUE-M 200) INJECTION 41%
20.0000 mL | Freq: Once | INTRAMUSCULAR | Status: AC
  Administered 2023-02-08: 20 mL via INTRATHECAL
